# Patient Record
Sex: Female | Born: 1990 | Race: White | Hispanic: No | Marital: Married | Smoking: Never smoker
Health system: Southern US, Community
[De-identification: ages and names within clinical notes are randomized; demographics above are authoritative.]

## PROBLEM LIST (undated history)

## (undated) DIAGNOSIS — O139 Gestational [pregnancy-induced] hypertension without significant proteinuria, unspecified trimester: Secondary | ICD-10-CM

## (undated) DIAGNOSIS — N809 Endometriosis, unspecified: Secondary | ICD-10-CM

## (undated) DIAGNOSIS — D509 Iron deficiency anemia, unspecified: Secondary | ICD-10-CM

## (undated) DIAGNOSIS — J45909 Unspecified asthma, uncomplicated: Secondary | ICD-10-CM

## (undated) DIAGNOSIS — K589 Irritable bowel syndrome without diarrhea: Secondary | ICD-10-CM

## (undated) DIAGNOSIS — K219 Gastro-esophageal reflux disease without esophagitis: Secondary | ICD-10-CM

## (undated) HISTORY — PX: HERNIA REPAIR: SHX51

## (undated) HISTORY — PX: WISDOM TOOTH EXTRACTION: SHX21

---

## 2013-03-13 DIAGNOSIS — D509 Iron deficiency anemia, unspecified: Secondary | ICD-10-CM | POA: Insufficient documentation

## 2013-08-28 DIAGNOSIS — J45909 Unspecified asthma, uncomplicated: Secondary | ICD-10-CM | POA: Insufficient documentation

## 2019-05-21 DIAGNOSIS — Z3402 Encounter for supervision of normal first pregnancy, second trimester: Secondary | ICD-10-CM | POA: Insufficient documentation

## 2019-05-24 LAB — OB RESULTS CONSOLE HEPATITIS B SURFACE ANTIGEN: Hepatitis B Surface Ag: NEGATIVE

## 2019-05-24 LAB — OB RESULTS CONSOLE RUBELLA ANTIBODY, IGM: Rubella: IMMUNE

## 2019-05-24 LAB — OB RESULTS CONSOLE ABO/RH: RH Type: POSITIVE

## 2019-05-24 LAB — OB RESULTS CONSOLE ANTIBODY SCREEN: Antibody Screen: NEGATIVE

## 2019-05-24 LAB — OB RESULTS CONSOLE GC/CHLAMYDIA
Chlamydia: NEGATIVE
Gonorrhea: NEGATIVE

## 2019-05-24 LAB — OB RESULTS CONSOLE VARICELLA ZOSTER ANTIBODY, IGG: Varicella: IMMUNE

## 2019-09-14 ENCOUNTER — Inpatient Hospital Stay (HOSPITAL_COMMUNITY): Admission: AD | Admit: 2019-09-14 | Payer: 59 | Source: Home / Self Care | Admitting: Family Medicine

## 2019-10-08 ENCOUNTER — Observation Stay: Payer: 59

## 2019-10-08 ENCOUNTER — Other Ambulatory Visit: Payer: Self-pay

## 2019-10-08 ENCOUNTER — Observation Stay
Admission: EM | Admit: 2019-10-08 | Discharge: 2019-10-08 | Disposition: A | Discharge status: Home / Self Care | Payer: 59 | Attending: Certified Nurse Midwife | Admitting: Certified Nurse Midwife

## 2019-10-08 DIAGNOSIS — R109 Unspecified abdominal pain: Secondary | ICD-10-CM

## 2019-10-08 DIAGNOSIS — Z3A32 32 weeks gestation of pregnancy: Secondary | ICD-10-CM | POA: Diagnosis not present

## 2019-10-08 DIAGNOSIS — R197 Diarrhea, unspecified: Secondary | ICD-10-CM | POA: Insufficient documentation

## 2019-10-08 DIAGNOSIS — O26893 Other specified pregnancy related conditions, third trimester: Secondary | ICD-10-CM | POA: Diagnosis not present

## 2019-10-08 DIAGNOSIS — R1031 Right lower quadrant pain: Secondary | ICD-10-CM | POA: Insufficient documentation

## 2019-10-08 DIAGNOSIS — O99891 Other specified diseases and conditions complicating pregnancy: Secondary | ICD-10-CM | POA: Diagnosis not present

## 2019-10-08 DIAGNOSIS — O26899 Other specified pregnancy related conditions, unspecified trimester: Secondary | ICD-10-CM | POA: Diagnosis present

## 2019-10-08 DIAGNOSIS — O212 Late vomiting of pregnancy: Secondary | ICD-10-CM | POA: Insufficient documentation

## 2019-10-08 HISTORY — DX: Unspecified asthma, uncomplicated: J45.909

## 2019-10-08 LAB — URINALYSIS, COMPLETE (UACMP) WITH MICROSCOPIC
Bacteria, UA: NONE SEEN
Bilirubin Urine: NEGATIVE
Glucose, UA: NEGATIVE mg/dL
Hgb urine dipstick: NEGATIVE
Ketones, ur: 5 mg/dL — AB
Nitrite: NEGATIVE
Protein, ur: NEGATIVE mg/dL
Specific Gravity, Urine: 1.013 (ref 1.005–1.030)
pH: 6 (ref 5.0–8.0)

## 2019-10-08 LAB — COMPREHENSIVE METABOLIC PANEL
ALT: 10 U/L (ref 0–44)
AST: 14 U/L — ABNORMAL LOW (ref 15–41)
Albumin: 3 g/dL — ABNORMAL LOW (ref 3.5–5.0)
Alkaline Phosphatase: 82 U/L (ref 38–126)
Anion gap: 8 (ref 5–15)
BUN: 5 mg/dL — ABNORMAL LOW (ref 6–20)
CO2: 21 mmol/L — ABNORMAL LOW (ref 22–32)
Calcium: 8.8 mg/dL — ABNORMAL LOW (ref 8.9–10.3)
Chloride: 107 mmol/L (ref 98–111)
Creatinine, Ser: 0.45 mg/dL (ref 0.44–1.00)
GFR calc Af Amer: >60 mL/min (ref >60)
GFR calc non Af Amer: >60 mL/min (ref >60)
Glucose, Bld: 91 mg/dL (ref 70–99)
Potassium: 3.7 mmol/L (ref 3.5–5.1)
Sodium: 136 mmol/L (ref 135–145)
Total Bilirubin: 0.5 mg/dL (ref 0.3–1.2)
Total Protein: 6.5 g/dL (ref 6.5–8.1)

## 2019-10-08 LAB — LIPASE, BLOOD: Lipase: 20 U/L (ref 11–51)

## 2019-10-08 LAB — CBC
HCT: 31.2 % — ABNORMAL LOW (ref 36.0–46.0)
Hemoglobin: 10.5 g/dL — ABNORMAL LOW (ref 12.0–15.0)
MCH: 25.7 pg — ABNORMAL LOW (ref 26.0–34.0)
MCHC: 33.7 g/dL (ref 30.0–36.0)
MCV: 76.5 fL — ABNORMAL LOW (ref 80.0–100.0)
Platelets: 277 10*3/uL (ref 150–400)
RBC: 4.08 MIL/uL (ref 3.87–5.11)
RDW: 13.2 % (ref 11.5–15.5)
WBC: 10.5 10*3/uL (ref 4.0–10.5)
nRBC: 0 % (ref 0.0–0.2)

## 2019-10-08 LAB — AMYLASE: Amylase: 37 U/L (ref 28–100)

## 2019-10-08 MED ORDER — SIMETHICONE 80 MG PO CHEW
240.0000 mg | CHEWABLE_TABLET | Freq: Once | ORAL | Status: AC
  Administered 2019-10-08: 240 mg via ORAL
  Filled 2019-10-08: qty 3

## 2019-10-08 MED ORDER — LACTATED RINGERS IV BOLUS
1000.0000 mL | Freq: Once | INTRAVENOUS | Status: AC
  Administered 2019-10-08: 1000 mL via INTRAVENOUS

## 2019-10-08 MED ORDER — ONDANSETRON HCL 4 MG/2ML IJ SOLN
4.0000 mg | Freq: Three times a day (TID) | INTRAMUSCULAR | Status: DC | PRN
  Administered 2019-10-08: 4 mg via INTRAVENOUS
  Filled 2019-10-08: qty 2

## 2019-10-08 MED ORDER — ACETAMINOPHEN 500 MG PO TABS
1000.0000 mg | ORAL_TABLET | Freq: Four times a day (QID) | ORAL | Status: DC | PRN
  Administered 2019-10-08: 1000 mg via ORAL
  Filled 2019-10-08: qty 2

## 2019-10-08 MED ORDER — LOPERAMIDE HCL 2 MG PO CAPS
2.0000 mg | ORAL_CAPSULE | ORAL | Status: DC | PRN
  Filled 2019-10-08: qty 1

## 2019-10-08 NOTE — Discharge Summary (Signed)
Patient discharged home, discharge instructions given, patient states understanding. Patient left floor in stable condition, denies any other needs at this time. Patient to keep next scheduled OB appointment for tomorrow

## 2019-10-08 NOTE — Discharge Instructions (Signed)
Call your provider for any other concerns. Keep you next scheduled OB appointment.

## 2019-10-08 NOTE — OB Triage Note (Signed)
Pt is a G1P0 and [redacted]w[redacted]d presenting to L&D with c/o lower right abdominal pain radiating to her back. Pt states pain has been there all weekend but intensified around 0400 this morning. Pt rates pain 8/10 on a 0-10 pain scale. Pt denies LOF or VB and confirms positive fetal movement. Pt states she is well hydrated and denies burning with urination. Pt denies recent intercourse. Pt states she has thrown up 3 times since 0400 this morning and states she has had 7-8 diarrhea occurrences the last 24 hours. Pt was out in the sun for a long time yesterday. Monitors applied and assessing, VSS. No further needs at this time.

## 2019-10-08 NOTE — Discharge Summary (Signed)
Suzanne Romero is a 29 y.o. female. She is at [redacted]w[redacted]d gestation. Patient's last menstrual period was 02/26/2019. Estimated Date of Delivery: 12/03/19  [facility]renatal care site: [facility]  Chief complaint: RLQ abdominal pain Location: RLQ, radiating to her right side and back Onset/timing: 2-3 days ago, but worsened this morning around 0400 Duration: constant, but waxes and wanes in intensity Quality: cramping, sharp Severity: 8/10 Associated signs/symptoms: vomiting, diarrhea Context: Suzanne Romero reports a right lower quadrant abdominal pain that started earlier this weekend, but worsened yesterday evening and then again around 0400 this morning. It is severe at this time. She has had nausea with the pain, and has 3 episodes of emesis since 0400 today. She also reports 7-8 episodes of diarrhea in the past 24 hours. No recent intercourse.  S: Resting comfortably.   She reports:  -active fetal movement -no leakage of fluid -no vaginal bleeding -no contractions  Maternal Medical History:   Past Medical History:  Diagnosis Date  . Asthma     Past Surgical History:  Procedure Laterality Date  . HERNIA REPAIR      No Known Allergies  Prior to Admission medications   Medication Sig Start Date End Date Taking? Authorizing Provider  magnesium oxide (MAG-OX) 400 MG tablet Take 400 mg by mouth daily.   Yes [provider]  Prenatal Vit-Fe Fumarate-FA (PRENATAL MULTIVITAMIN) TABS tablet Take 1 tablet by mouth daily at 12 noon.   Yes [provider]     Social History: She  reports that she has never smoked. She has never used smokeless tobacco. She reports previous alcohol use. She reports that she does not use drugs.  Family History: family history includes Hypertension in her father and maternal grandfather; Stroke in her paternal grandmother.   Review of Systems: A full review of systems was performed and negative except as noted in the HPI.     O:  BP (!)  119/57   Pulse 62   Temp 98.1 F (36.7 C) (Oral)   Resp 16   LMP 02/26/2019  Results for orders placed or performed during the hospital encounter of 10/08/19 (from the past 48 hour(s))  Urinalysis, Complete w Microscopic   Collection Time: 10/08/19  5:21 AM  Result Value Ref Range   Color, Urine YELLOW (A) YELLOW   APPearance HAZY (A) CLEAR   Specific Gravity, Urine 1.013 1.005 - 1.030   pH 6.0 5.0 - 8.0   Glucose, UA NEGATIVE NEGATIVE mg/dL   Hgb urine dipstick NEGATIVE NEGATIVE   Bilirubin Urine NEGATIVE NEGATIVE   Ketones, ur 5 (A) NEGATIVE mg/dL   Protein, ur NEGATIVE NEGATIVE mg/dL   Nitrite NEGATIVE NEGATIVE   Leukocytes,Ua SMALL (A) NEGATIVE   WBC, UA 0-5 0 - 5 WBC/hpf   Bacteria, UA NONE SEEN NONE SEEN   Squamous Epithelial / LPF 11-20 0 - 5   Mucus PRESENT   Comprehensive metabolic panel   Collection Time: 10/08/19  5:33 AM  Result Value Ref Range   Sodium 136 135 - 145 mmol/L   Potassium 3.7 3.5 - 5.1 mmol/L   Chloride 107 98 - 111 mmol/L   CO2 21 (L) 22 - 32 mmol/L   Glucose, Bld 91 70 - 99 mg/dL   BUN 5 (L) 6 - 20 mg/dL   Creatinine, Ser 0.45 0.44 - 1.00 mg/dL   Calcium 8.8 (L) 8.9 - 10.3 mg/dL   Total Protein 6.5 6.5 - 8.1 g/dL   Albumin 3.0 (L) 3.5 - 5.0 g/dL  AST 14 (L) 15 - 41 U/L   ALT 10 0 - 44 U/L   Alkaline Phosphatase 82 38 - 126 U/L   Total Bilirubin 0.5 0.3 - 1.2 mg/dL   GFR calc non Af Amer >60 >60 mL/min   GFR calc Af Amer >60 >60 mL/min   Anion gap 8 5 - 15  Lipase, blood   Collection Time: 10/08/19  5:33 AM  Result Value Ref Range   Lipase 20 11 - 51 U/L  CBC on admission   Collection Time: 10/08/19  5:33 AM  Result Value Ref Range   WBC 10.5 4.0 - 10.5 K/uL   RBC 4.08 3.87 - 5.11 MIL/uL   Hemoglobin 10.5 (L) 12.0 - 15.0 g/dL   HCT 31.2 (L) 36.0 - 46.0 %   MCV 76.5 (L) 80.0 - 100.0 fL   MCH 25.7 (L) 26.0 - 34.0 pg   MCHC 33.7 30.0 - 36.0 g/dL   RDW 13.2 11.5 - 15.5 %   Platelets 277 150 - 400 K/uL   nRBC 0.0 0.0 - 0.2 %   Amylase   Collection Time: 10/08/19  5:33 AM  Result Value Ref Range   Amylase 37 28 - 100 U/L     Constitutional: mild distress, AAOx3  HE/ENT: extraocular movements grossly intact, moist mucous membranes CV: RRR PULM: normal respiratory effort, CTABL Back: symmetric, no CVAT  Abd: gravid, non-distended, soft, RLQ and right flank tenderness to palpation, no rebound tenderness Ext: Non-tender, Nonedmeatous   Psych: mood appropriate, speech normal Pelvic deferred  NST:  Baseline: 145bpm Variability: moderate Accelerations: 15x15 present x >2 Decelerations: absent Time: 56mins Toco: quiet   A/P: 29 y.o. [redacted]w[redacted]d here for antenatal surveillance during pregnancy.  Principle diagnosis: RLQ abdominal and R flank pain during pregnancy  Labor  Not present  Fetal Wellbeing  Reactive NST, reassuring for GA  RLQ abdominal and flank pain  Afebrile, VSS  CBC, CMP, amylase, lipase, and UA WNL  Renal ultrasound WNL, lots of gas per ultrasound tech  S/p Tylenol 1000mg  and simethicone 240mg   Pain improved from 8/10 to 2-3/10  Nausea, vomiting, diarrhea  S/p 1L IV fluid bolus   S/p Zofran 4mg  IV once  Imodium PRN for any further episodes of diarrhea  Discharge home stable, precautions reviewed, follow-up as schedule   Lisette Grinder 10/08/2019 7:39 AM  ----- Lisette Grinder, CNM Certified Nurse Midwife [facility], Department of [facility]

## 2019-10-08 NOTE — Progress Notes (Signed)
   Malak Duchesneau is a 29 y.o. female. She is at [redacted]w[redacted]d gestation. Patient's last menstrual period was 02/26/2019. Estimated Date of Delivery: 12/03/19  Prenatal care site: [facility]  Chief complaint: RLQ abdominal pain Location: RLQ, radiating to her right side and back Onset/timing: 2-3 days ago, but worsened this morning around 0400 Duration: constant, but waxes and wanes in intensity Quality: cramping, sharp Severity: 8/10 Associated signs/symptoms: vomiting, diarrhea Context: Taimane reports a right lower quadrant abdominal pain that started earlier this weekend, but worsened yesterday evening and then again around 0400 this morning. It is severe at this time. She has had nausea with the pain, and has 3 episodes of emesis since 0400 today. She also reports 7-8 episodes of diarrhea in the past 24 hours. No recent intercourse.  S: Resting comfortably.   She reports:  -active fetal movement -no leakage of fluid -no vaginal bleeding -no contractions  Maternal Medical History:   Past Medical History:  Diagnosis Date  . Asthma     Past Surgical History:  Procedure Laterality Date  . HERNIA REPAIR      No Known Allergies  Prior to Admission medications   Medication Sig Start Date End Date Taking? Authorizing Provider  magnesium oxide (MAG-OX) 400 MG tablet Take 400 mg by mouth daily.   Yes [provider]  Prenatal Vit-Fe Fumarate-FA (PRENATAL MULTIVITAMIN) TABS tablet Take 1 tablet by mouth daily at 12 noon.   Yes [provider]     Social History: She  reports that she has never smoked. She has never used smokeless tobacco. She reports previous alcohol use. She reports that she does not use drugs.  Family History: family history includes Hypertension in her father and maternal grandfather; Stroke in her paternal grandmother.   Review of Systems: A full review of systems was performed and negative except as noted in the HPI.     O:  BP 126/70  (BP Location: Right Arm)   Pulse 83   Temp 98.1 F (36.7 C) (Oral)   Resp 16   LMP 02/26/2019  No results found for this or any previous visit (from the past 48 hour(s)).   Constitutional: mild distress, AAOx3  HE/ENT: extraocular movements grossly intact, moist mucous membranes CV: RRR PULM: normal respiratory effort, CTABL Back: symmetric, no CVAT  Abd: gravid, non-distended, soft, RLQ and right flank tenderness to palpation, no rebound tenderness Ext: Non-tender, Nonedmeatous   Psych: mood appropriate, speech normal Pelvic deferred  NST:  Baseline: 145bpm Variability: moderate Accelerations: 15x15 present x >2 Decelerations: absent Time: 7mins Toco: quiet   A/P: 29 y.o. [redacted]w[redacted]d here for antenatal surveillance during pregnancy.  Principle diagnosis: RLQ abdomina and R flank pain during pregnancy  Labor  Not present  Fetal Wellbeing  Reactive NST, reassuring for GA  RLQ abdominal and flank pain  Afebrile, VSS  CBC, CMP, amylase, lipase, and UA  Renal ultrasound ordered  Consider MRI if ultrasound normal  Nausea, vomiting, diarrhea  IV fluid bolus of 1L LR ordered   Lisette Grinder 10/08/2019 5:37 AM  ----- Lisette Grinder, CNM Certified Nurse Midwife [facility], Department of OB/GYN [facility]

## 2019-11-08 LAB — OB RESULTS CONSOLE HIV ANTIBODY (ROUTINE TESTING): HIV: NONREACTIVE

## 2019-11-08 LAB — OB RESULTS CONSOLE RPR: RPR: NONREACTIVE

## 2019-11-08 LAB — OB RESULTS CONSOLE GBS: GBS: NEGATIVE

## 2019-11-29 ENCOUNTER — Inpatient Hospital Stay
Admission: EM | Admit: 2019-11-29 | Discharge: 2019-12-03 | DRG: 787 | Disposition: A | Discharge status: Home / Self Care | Payer: 59

## 2019-11-29 ENCOUNTER — Other Ambulatory Visit: Payer: Self-pay

## 2019-11-29 ENCOUNTER — Encounter: Payer: Self-pay | Admitting: Obstetrics & Gynecology

## 2019-11-29 DIAGNOSIS — D62 Acute posthemorrhagic anemia: Secondary | ICD-10-CM | POA: Diagnosis not present

## 2019-11-29 DIAGNOSIS — O139 Gestational [pregnancy-induced] hypertension without significant proteinuria, unspecified trimester: Secondary | ICD-10-CM | POA: Diagnosis present

## 2019-11-29 DIAGNOSIS — N800 Endometriosis of uterus: Secondary | ICD-10-CM | POA: Diagnosis present

## 2019-11-29 DIAGNOSIS — O99892 Other specified diseases and conditions complicating childbirth: Secondary | ICD-10-CM | POA: Diagnosis present

## 2019-11-29 DIAGNOSIS — Z3A39 39 weeks gestation of pregnancy: Secondary | ICD-10-CM

## 2019-11-29 DIAGNOSIS — Z20822 Contact with and (suspected) exposure to covid-19: Secondary | ICD-10-CM | POA: Diagnosis present

## 2019-11-29 DIAGNOSIS — O134 Gestational [pregnancy-induced] hypertension without significant proteinuria, complicating childbirth: Principal | ICD-10-CM | POA: Diagnosis present

## 2019-11-29 DIAGNOSIS — O9081 Anemia of the puerperium: Secondary | ICD-10-CM | POA: Diagnosis not present

## 2019-11-29 DIAGNOSIS — O219 Vomiting of pregnancy, unspecified: Secondary | ICD-10-CM | POA: Diagnosis present

## 2019-11-29 DIAGNOSIS — D252 Subserosal leiomyoma of uterus: Secondary | ICD-10-CM | POA: Diagnosis present

## 2019-11-29 DIAGNOSIS — O3413 Maternal care for benign tumor of corpus uteri, third trimester: Secondary | ICD-10-CM | POA: Diagnosis present

## 2019-11-29 LAB — SARS CORONAVIRUS 2 BY RT PCR (HOSPITAL ORDER, PERFORMED IN ~~LOC~~ HOSPITAL LAB): SARS Coronavirus 2: NEGATIVE

## 2019-11-29 LAB — CBC
HCT: 31.9 % — ABNORMAL LOW (ref 36.0–46.0)
Hemoglobin: 10 g/dL — ABNORMAL LOW (ref 12.0–15.0)
MCH: 24.3 pg — ABNORMAL LOW (ref 26.0–34.0)
MCHC: 31.3 g/dL (ref 30.0–36.0)
MCV: 77.4 fL — ABNORMAL LOW (ref 80.0–100.0)
Platelets: 247 10*3/uL (ref 150–400)
RBC: 4.12 MIL/uL (ref 3.87–5.11)
RDW: 15 % (ref 11.5–15.5)
WBC: 12.7 10*3/uL — ABNORMAL HIGH (ref 4.0–10.5)
nRBC: 0 % (ref 0.0–0.2)

## 2019-11-29 LAB — COMPREHENSIVE METABOLIC PANEL
ALT: 10 U/L (ref 0–44)
AST: 15 U/L (ref 15–41)
Albumin: 2.9 g/dL — ABNORMAL LOW (ref 3.5–5.0)
Alkaline Phosphatase: 123 U/L (ref 38–126)
Anion gap: 10 (ref 5–15)
BUN: 9 mg/dL (ref 6–20)
CO2: 20 mmol/L — ABNORMAL LOW (ref 22–32)
Calcium: 8.7 mg/dL — ABNORMAL LOW (ref 8.9–10.3)
Chloride: 107 mmol/L (ref 98–111)
Creatinine, Ser: 0.55 mg/dL (ref 0.44–1.00)
GFR calc Af Amer: >60 mL/min (ref >60)
GFR calc non Af Amer: >60 mL/min (ref >60)
Glucose, Bld: 82 mg/dL (ref 70–99)
Potassium: 3.8 mmol/L (ref 3.5–5.1)
Sodium: 137 mmol/L (ref 135–145)
Total Bilirubin: 0.5 mg/dL (ref 0.3–1.2)
Total Protein: 6.4 g/dL — ABNORMAL LOW (ref 6.5–8.1)

## 2019-11-29 MED ORDER — MAGNESIUM OXIDE 400 (241.3 MG) MG PO TABS
800.0000 mg | ORAL_TABLET | Freq: Once | ORAL | Status: AC
  Administered 2019-11-30: 800 mg via ORAL
  Filled 2019-11-29: qty 2

## 2019-11-29 MED ORDER — CALCIUM CARBONATE ANTACID 500 MG PO CHEW: 2.0000 | CHEWABLE_TABLET | ORAL | Status: DC | PRN

## 2019-11-29 MED ORDER — LACTATED RINGERS IV BOLUS
1000.0000 mL | Freq: Once | INTRAVENOUS | Status: AC
  Administered 2019-11-29: 1000 mL via INTRAVENOUS

## 2019-11-29 MED ORDER — ONDANSETRON HCL 4 MG/2ML IJ SOLN
4.0000 mg | Freq: Four times a day (QID) | INTRAMUSCULAR | Status: DC
  Administered 2019-11-29: 4 mg via INTRAVENOUS
  Filled 2019-11-29: qty 2

## 2019-11-29 MED ORDER — ACETAMINOPHEN 325 MG PO TABS
650.0000 mg | ORAL_TABLET | ORAL | Status: DC | PRN
  Administered 2019-11-30: 650 mg via ORAL
  Filled 2019-11-29: qty 2

## 2019-11-30 ENCOUNTER — Inpatient Hospital Stay: Payer: 59 | Admitting: Anesthesiology

## 2019-11-30 ENCOUNTER — Encounter: Payer: Self-pay | Admitting: Obstetrics & Gynecology

## 2019-11-30 ENCOUNTER — Encounter: Admission: EM | Disposition: A | Discharge status: Home / Self Care | Payer: Self-pay | Source: Home / Self Care

## 2019-11-30 DIAGNOSIS — N800 Endometriosis of uterus: Secondary | ICD-10-CM | POA: Diagnosis present

## 2019-11-30 DIAGNOSIS — O139 Gestational [pregnancy-induced] hypertension without significant proteinuria, unspecified trimester: Secondary | ICD-10-CM | POA: Diagnosis present

## 2019-11-30 DIAGNOSIS — D62 Acute posthemorrhagic anemia: Secondary | ICD-10-CM | POA: Diagnosis not present

## 2019-11-30 DIAGNOSIS — O26893 Other specified pregnancy related conditions, third trimester: Secondary | ICD-10-CM | POA: Diagnosis present

## 2019-11-30 DIAGNOSIS — Z3A39 39 weeks gestation of pregnancy: Secondary | ICD-10-CM | POA: Diagnosis not present

## 2019-11-30 DIAGNOSIS — O134 Gestational [pregnancy-induced] hypertension without significant proteinuria, complicating childbirth: Secondary | ICD-10-CM | POA: Diagnosis present

## 2019-11-30 DIAGNOSIS — Z20822 Contact with and (suspected) exposure to covid-19: Secondary | ICD-10-CM | POA: Diagnosis present

## 2019-11-30 DIAGNOSIS — D252 Subserosal leiomyoma of uterus: Secondary | ICD-10-CM | POA: Diagnosis present

## 2019-11-30 DIAGNOSIS — O99892 Other specified diseases and conditions complicating childbirth: Secondary | ICD-10-CM | POA: Diagnosis present

## 2019-11-30 DIAGNOSIS — K589 Irritable bowel syndrome without diarrhea: Secondary | ICD-10-CM | POA: Insufficient documentation

## 2019-11-30 DIAGNOSIS — O3413 Maternal care for benign tumor of corpus uteri, third trimester: Secondary | ICD-10-CM | POA: Diagnosis present

## 2019-11-30 DIAGNOSIS — O9081 Anemia of the puerperium: Secondary | ICD-10-CM | POA: Diagnosis not present

## 2019-11-30 LAB — SAMPLE TO BLOOD BANK

## 2019-11-30 LAB — PROTEIN / CREATININE RATIO, URINE
Creatinine, Urine: 39 mg/dL
Protein Creatinine Ratio: 0.26 mg/mg{Cre} — ABNORMAL HIGH (ref 0.00–0.15)
Total Protein, Urine: 10 mg/dL

## 2019-11-30 SURGERY — Surgical Case
Anesthesia: Epidural

## 2019-11-30 MED ORDER — OXYTOCIN-SODIUM CHLORIDE 30-0.9 UT/500ML-% IV SOLN
INTRAVENOUS | Status: DC | PRN
  Administered 2019-11-30: 400 mL via INTRAVENOUS

## 2019-11-30 MED ORDER — BUPIVACAINE 0.25 % ON-Q PUMP DUAL CATH 400 ML
400.0000 mL | INJECTION | Status: DC
  Filled 2019-11-30: qty 400

## 2019-11-30 MED ORDER — BUPIVACAINE HCL (PF) 0.5 % IJ SOLN
INTRAMUSCULAR | Status: AC
  Filled 2019-11-30: qty 30

## 2019-11-30 MED ORDER — FENTANYL 2.5 MCG/ML W/ROPIVACAINE 0.15% IN NS 100 ML EPIDURAL (ARMC)
EPIDURAL | Status: AC
  Filled 2019-11-30: qty 100

## 2019-11-30 MED ORDER — KETOROLAC TROMETHAMINE 15 MG/ML IJ SOLN
15.0000 mg | Freq: Four times a day (QID) | INTRAMUSCULAR | Status: DC | PRN
  Administered 2019-11-30 – 2019-12-01 (×3): 15 mg via INTRAVENOUS
  Filled 2019-11-30 (×6): qty 1

## 2019-11-30 MED ORDER — AMMONIA AROMATIC IN INHA
RESPIRATORY_TRACT | Status: AC
  Filled 2019-11-30: qty 10

## 2019-11-30 MED ORDER — BUPIVACAINE HCL (PF) 0.5 % IJ SOLN
INTRAMUSCULAR | Status: DC | PRN
  Administered 2019-11-30: 30 mL

## 2019-11-30 MED ORDER — NALBUPHINE HCL 10 MG/ML IJ SOLN: 2.5000 mg | Freq: Four times a day (QID) | INTRAMUSCULAR | Status: DC | PRN

## 2019-11-30 MED ORDER — COCONUT OIL OIL
1.0000 "application " | TOPICAL_OIL | Status: DC | PRN
  Filled 2019-11-30: qty 120

## 2019-11-30 MED ORDER — LIDOCAINE HCL (PF) 1 % IJ SOLN: 30.0000 mL | INTRAMUSCULAR | Status: DC | PRN

## 2019-11-30 MED ORDER — ACETAMINOPHEN 500 MG PO TABS: 1000.0000 mg | ORAL_TABLET | Freq: Four times a day (QID) | ORAL | Status: DC | PRN

## 2019-11-30 MED ORDER — IBUPROFEN 800 MG PO TABS
800.0000 mg | ORAL_TABLET | Freq: Three times a day (TID) | ORAL | Status: DC
  Administered 2019-12-01 – 2019-12-03 (×7): 800 mg via ORAL
  Filled 2019-11-30 (×7): qty 1

## 2019-11-30 MED ORDER — SOD CITRATE-CITRIC ACID 500-334 MG/5ML PO SOLN: 30.0000 mL | ORAL | Status: AC

## 2019-11-30 MED ORDER — LACTATED RINGERS IV SOLN: 500.0000 mL | Freq: Once | INTRAVENOUS | Status: DC

## 2019-11-30 MED ORDER — LACTATED RINGERS IV SOLN: INTRAVENOUS | Status: DC

## 2019-11-30 MED ORDER — LACTATED RINGERS IV SOLN: 500.0000 mL | INTRAVENOUS | Status: DC | PRN

## 2019-11-30 MED ORDER — OXYTOCIN 10 UNIT/ML IJ SOLN
INTRAMUSCULAR | Status: AC
  Filled 2019-11-30: qty 2

## 2019-11-30 MED ORDER — SOD CITRATE-CITRIC ACID 500-334 MG/5ML PO SOLN
ORAL | Status: AC
  Administered 2019-11-30: 30 mL via ORAL
  Filled 2019-11-30: qty 30

## 2019-11-30 MED ORDER — MORPHINE SULFATE (PF) 0.5 MG/ML IJ SOLN
INTRAMUSCULAR | Status: AC
  Filled 2019-11-30: qty 10

## 2019-11-30 MED ORDER — LIDOCAINE HCL (PF) 1 % IJ SOLN
INTRAMUSCULAR | Status: DC | PRN
  Administered 2019-11-30: 3 mL

## 2019-11-30 MED ORDER — ONDANSETRON HCL 4 MG/2ML IJ SOLN: 4.0000 mg | Freq: Four times a day (QID) | INTRAMUSCULAR | Status: DC | PRN

## 2019-11-30 MED ORDER — TETANUS-DIPHTH-ACELL PERTUSSIS 5-2.5-18.5 LF-MCG/0.5 IM SUSP
0.5000 mL | Freq: Once | INTRAMUSCULAR | Status: DC
  Filled 2019-11-30: qty 0.5

## 2019-11-30 MED ORDER — EPHEDRINE 5 MG/ML INJ: 10.0000 mg | INTRAVENOUS | Status: DC | PRN

## 2019-11-30 MED ORDER — ONDANSETRON HCL 4 MG/2ML IJ SOLN
INTRAMUSCULAR | Status: DC | PRN
  Administered 2019-11-30 (×2): 4 mg via INTRAVENOUS

## 2019-11-30 MED ORDER — MORPHINE SULFATE (PF) 0.5 MG/ML IJ SOLN
INTRAMUSCULAR | Status: DC | PRN
  Administered 2019-11-30: 3 mg via EPIDURAL

## 2019-11-30 MED ORDER — LACTATED RINGERS IV SOLN: INTRAVENOUS | Status: DC | PRN

## 2019-11-30 MED ORDER — LIDOCAINE-EPINEPHRINE (PF) 1.5 %-1:200000 IJ SOLN
INTRAMUSCULAR | Status: DC | PRN
  Administered 2019-11-30: 3 mL via PERINEURAL

## 2019-11-30 MED ORDER — LIDOCAINE 2% (20 MG/ML) 5 ML SYRINGE
INTRAMUSCULAR | Status: DC | PRN
  Administered 2019-11-30 (×2): 5 mL via INTRAVENOUS
  Administered 2019-11-30: 2 mL via INTRAVENOUS
  Administered 2019-11-30: 3 mL via INTRAVENOUS
  Administered 2019-11-30 (×2): 5 mL via INTRAVENOUS

## 2019-11-30 MED ORDER — FENTANYL 2.5 MCG/ML W/ROPIVACAINE 0.15% IN NS 100 ML EPIDURAL (ARMC)
EPIDURAL | Status: DC | PRN
  Administered 2019-11-30: 12 mL/h via EPIDURAL

## 2019-11-30 MED ORDER — ONDANSETRON HCL 4 MG/2ML IJ SOLN: 4.0000 mg | Freq: Once | INTRAMUSCULAR | Status: DC | PRN

## 2019-11-30 MED ORDER — SOD CITRATE-CITRIC ACID 500-334 MG/5ML PO SOLN: 30.0000 mL | ORAL | Status: DC | PRN

## 2019-11-30 MED ORDER — LIDOCAINE HCL (PF) 1 % IJ SOLN
INTRAMUSCULAR | Status: AC
  Filled 2019-11-30: qty 30

## 2019-11-30 MED ORDER — OXYCODONE-ACETAMINOPHEN 5-325 MG PO TABS
1.0000 | ORAL_TABLET | ORAL | Status: DC | PRN
  Administered 2019-12-02: 1 via ORAL
  Filled 2019-11-30: qty 1
  Filled 2019-11-30: qty 2

## 2019-11-30 MED ORDER — SODIUM CHLORIDE 0.9 % IV SOLN
INTRAVENOUS | Status: DC | PRN
  Administered 2019-11-30: 30 ug/min via INTRAVENOUS

## 2019-11-30 MED ORDER — BUPIVACAINE HCL (PF) 0.25 % IJ SOLN
INTRAMUSCULAR | Status: DC | PRN
  Administered 2019-11-30 (×2): 5 mL via EPIDURAL

## 2019-11-30 MED ORDER — PHENYLEPHRINE 40 MCG/ML (10ML) SYRINGE FOR IV PUSH (FOR BLOOD PRESSURE SUPPORT): 80.0000 ug | PREFILLED_SYRINGE | INTRAVENOUS | Status: DC | PRN

## 2019-11-30 MED ORDER — MENTHOL 3 MG MT LOZG
1.0000 | LOZENGE | OROMUCOSAL | Status: DC | PRN
  Filled 2019-11-30: qty 9

## 2019-11-30 MED ORDER — MISOPROSTOL 50MCG HALF TABLET
50.0000 ug | ORAL_TABLET | ORAL | Status: DC | PRN
  Administered 2019-11-30 (×2): 50 ug via BUCCAL
  Filled 2019-11-30 (×2): qty 1

## 2019-11-30 MED ORDER — MORPHINE SULFATE (PF) 2 MG/ML IV SOLN: 1.0000 mg | INTRAVENOUS | Status: AC | PRN

## 2019-11-30 MED ORDER — FENTANYL 2.5 MCG/ML W/ROPIVACAINE 0.15% IN NS 100 ML EPIDURAL (ARMC): 12.0000 mL/h | EPIDURAL | Status: DC

## 2019-11-30 MED ORDER — SENNOSIDES-DOCUSATE SODIUM 8.6-50 MG PO TABS
2.0000 | ORAL_TABLET | ORAL | Status: DC
  Administered 2019-12-01 – 2019-12-03 (×3): 2 via ORAL
  Filled 2019-11-30 (×3): qty 2

## 2019-11-30 MED ORDER — BUTORPHANOL TARTRATE 1 MG/ML IJ SOLN: 1.0000 mg | INTRAMUSCULAR | Status: DC | PRN

## 2019-11-30 MED ORDER — SIMETHICONE 80 MG PO CHEW
80.0000 mg | CHEWABLE_TABLET | ORAL | Status: DC
  Administered 2019-12-01 – 2019-12-02 (×2): 80 mg via ORAL
  Filled 2019-11-30 (×2): qty 1

## 2019-11-30 MED ORDER — FENTANYL CITRATE (PF) 100 MCG/2ML IJ SOLN
INTRAMUSCULAR | Status: AC
  Filled 2019-11-30: qty 2

## 2019-11-30 MED ORDER — OXYTOCIN-SODIUM CHLORIDE 30-0.9 UT/500ML-% IV SOLN
INTRAVENOUS | Status: AC
  Filled 2019-11-30: qty 500

## 2019-11-30 MED ORDER — WITCH HAZEL-GLYCERIN EX PADS: 1.0000 "application " | MEDICATED_PAD | CUTANEOUS | Status: DC | PRN

## 2019-11-30 MED ORDER — SIMETHICONE 80 MG PO CHEW
80.0000 mg | CHEWABLE_TABLET | Freq: Three times a day (TID) | ORAL | Status: DC
  Administered 2019-12-01 – 2019-12-03 (×7): 80 mg via ORAL
  Filled 2019-11-30 (×7): qty 1

## 2019-11-30 MED ORDER — OXYTOCIN-SODIUM CHLORIDE 30-0.9 UT/500ML-% IV SOLN: 1.0000 m[IU]/min | INTRAVENOUS | Status: DC

## 2019-11-30 MED ORDER — BUPIVACAINE HCL 0.5 % IJ SOLN
20.0000 mL | INTRAMUSCULAR | Status: DC
  Filled 2019-11-30: qty 20

## 2019-11-30 MED ORDER — OXYTOCIN-SODIUM CHLORIDE 30-0.9 UT/500ML-% IV SOLN
2.5000 [IU]/h | INTRAVENOUS | Status: AC
  Administered 2019-11-30: 2.5 [IU]/h via INTRAVENOUS
  Filled 2019-11-30: qty 500

## 2019-11-30 MED ORDER — OXYTOCIN-SODIUM CHLORIDE 30-0.9 UT/500ML-% IV SOLN: 2.5000 [IU]/h | INTRAVENOUS | Status: DC

## 2019-11-30 MED ORDER — CEFAZOLIN SODIUM-DEXTROSE 2-4 GM/100ML-% IV SOLN
2.0000 g | INTRAVENOUS | Status: AC
  Administered 2019-11-30: 2 g via INTRAVENOUS
  Filled 2019-11-30: qty 100

## 2019-11-30 MED ORDER — DIPHENHYDRAMINE HCL 50 MG/ML IJ SOLN: 12.5000 mg | INTRAMUSCULAR | Status: DC | PRN

## 2019-11-30 MED ORDER — DIPHENHYDRAMINE HCL 25 MG PO CAPS: 25.0000 mg | ORAL_CAPSULE | Freq: Four times a day (QID) | ORAL | Status: DC | PRN

## 2019-11-30 MED ORDER — TERBUTALINE SULFATE 1 MG/ML IJ SOLN
0.2500 mg | Freq: Once | INTRAMUSCULAR | Status: AC | PRN
  Administered 2019-11-30: 0.25 mg via SUBCUTANEOUS
  Filled 2019-11-30: qty 1

## 2019-11-30 MED ORDER — DIBUCAINE (PERIANAL) 1 % EX OINT: 1.0000 "application " | TOPICAL_OINTMENT | CUTANEOUS | Status: DC | PRN

## 2019-11-30 MED ORDER — OXYTOCIN BOLUS FROM INFUSION: 333.0000 mL | Freq: Once | INTRAVENOUS | Status: DC

## 2019-11-30 MED ORDER — PRENATAL MULTIVITAMIN CH
1.0000 | ORAL_TABLET | Freq: Every day | ORAL | Status: DC
  Administered 2019-12-01 – 2019-12-02 (×2): 1 via ORAL
  Filled 2019-11-30 (×2): qty 1

## 2019-11-30 MED ORDER — MISOPROSTOL 200 MCG PO TABS
ORAL_TABLET | ORAL | Status: AC
  Filled 2019-11-30: qty 4

## 2019-11-30 MED ORDER — FENTANYL CITRATE (PF) 100 MCG/2ML IJ SOLN
INTRAMUSCULAR | Status: DC | PRN
  Administered 2019-11-30 (×4): 50 ug via INTRAVENOUS

## 2019-11-30 MED ORDER — FENTANYL CITRATE (PF) 100 MCG/2ML IJ SOLN: 25.0000 ug | INTRAMUSCULAR | Status: DC | PRN

## 2019-11-30 MED ORDER — SIMETHICONE 80 MG PO CHEW
80.0000 mg | CHEWABLE_TABLET | ORAL | Status: DC | PRN
  Administered 2019-12-01 – 2019-12-02 (×2): 80 mg via ORAL
  Filled 2019-11-30 (×2): qty 1

## 2019-11-30 SURGICAL SUPPLY — 30 items
BAG COUNTER SPONGE EZ (MISCELLANEOUS) ×2 IMPLANT
CANISTER SUCT 3000ML PPV (MISCELLANEOUS) ×2 IMPLANT
CATH KIT ON-Q SILVERSOAK 5IN (CATHETERS) ×4 IMPLANT
CHLORAPREP W/TINT 26 (MISCELLANEOUS) ×4 IMPLANT
DERMABOND ADVANCED (GAUZE/BANDAGES/DRESSINGS) ×1
DERMABOND ADVANCED .7 DNX12 (GAUZE/BANDAGES/DRESSINGS) ×1 IMPLANT
DRSG OPSITE POSTOP 4X10 (GAUZE/BANDAGES/DRESSINGS) ×2 IMPLANT
DRSG TELFA 3X8 NADH (GAUZE/BANDAGES/DRESSINGS) ×2 IMPLANT
ELECT CAUTERY BLADE 6.4 (BLADE) ×2 IMPLANT
ELECT REM PT RETURN 9FT ADLT (ELECTROSURGICAL) ×2
ELECTRODE REM PT RTRN 9FT ADLT (ELECTROSURGICAL) ×1 IMPLANT
GAUZE SPONGE 4X4 12PLY STRL (GAUZE/BANDAGES/DRESSINGS) ×2 IMPLANT
GLOVE BIO SURGEON STRL SZ7 (GLOVE) ×2 IMPLANT
GLOVE INDICATOR 7.5 STRL GRN (GLOVE) ×2 IMPLANT
GOWN STRL REUS W/ TWL LRG LVL3 (GOWN DISPOSABLE) ×3 IMPLANT
GOWN STRL REUS W/TWL LRG LVL3 (GOWN DISPOSABLE) ×3
NS IRRIG 1000ML POUR BTL (IV SOLUTION) ×2 IMPLANT
PACK C SECTION (MISCELLANEOUS) ×2 IMPLANT
PAD OB MATERNITY 4.3X12.25 (PERSONAL CARE ITEMS) ×2 IMPLANT
PAD PREP 24X41 OB/GYN DISP (PERSONAL CARE ITEMS) ×2 IMPLANT
PENCIL SMOKE ULTRAEVAC 22 CON (MISCELLANEOUS) ×2 IMPLANT
STAPLER INSORB 30 2030 C-SECTI (MISCELLANEOUS) ×2 IMPLANT
STRIP CLOSURE SKIN 1/2X4 (GAUZE/BANDAGES/DRESSINGS) ×2 IMPLANT
SUCT VACUUM KIWI BELL (SUCTIONS) ×2 IMPLANT
SUT MNCRL AB 4-0 PS2 18 (SUTURE) ×2 IMPLANT
SUT PDS AB 1 TP1 96 (SUTURE) ×4 IMPLANT
SUT PLAIN 2 0 XLH (SUTURE) ×2 IMPLANT
SUT VIC AB 0 CTX 36 (SUTURE) ×2
SUT VIC AB 0 CTX36XBRD ANBCTRL (SUTURE) ×2 IMPLANT
SUT VIC AB 2-0 CT1 36 (SUTURE) ×4 IMPLANT

## 2019-11-30 NOTE — OB Triage Note (Signed)
Patient came in for observation for nausea/ vomiting and diarrhea since 2200. Patient denies uterine contractions and denies leaking of fluid. Patient denies vaginal bleeding and spotting. Patient reports +FM. Vital signs stable and patient afebrile. FHR baseline 130 with moderate variability with accelerations 15 x 15 and no decelerations. Significant other at bedside. Monitors applied and tracing.

## 2019-11-30 NOTE — Anesthesia Procedure Notes (Signed)
Epidural Patient location during procedure: OB Start time: 11/30/2019 10:38 AM End time: 11/30/2019 10:58 AM  Staffing Resident/CRNA: Nelda Marseille, CRNA Performed: resident/CRNA   Preanesthetic Checklist Completed: patient identified, IV checked, site marked, risks and benefits discussed, surgical consent, monitors and equipment checked, pre-op evaluation and timeout performed  Epidural Patient position: sitting Prep: Betadine Patient monitoring: heart rate, continuous pulse ox and blood pressure Approach: midline Location: L3-L4 Injection technique: LOR saline  Needle:  Needle type: Tuohy  Needle gauge: 17 G Needle length: 9 cm and 9 Catheter type: closed end flexible Catheter size: 20 Guage Test dose: negative and 1.5% lidocaine with Epi 1:200 K  Assessment Sensory level: T10 Events: blood not aspirated, injection not painful, no injection resistance, no paresthesia and negative IV test  Additional Notes   Patient tolerated the insertion well without complications.Reason for block:procedure for pain

## 2019-11-30 NOTE — Op Note (Signed)
Preoperative Diagnosis: 1) 29 y.o. G1P0 at [redacted]w[redacted]d with fetal intolerance to labor 2) Gestational hypertension  Postoperative Diagnosis: 1) 29 y.o. G1P1001 at [redacted]w[redacted]d with fetal intolerance to labor 2) Gestational hyptertension 3) Nuchal cord x 3 4) Meconium  Operation Performed: Primary low transverse C-section via pfannenstiel skin incision  Indication: Fetal intolerance to labor recurrent late decelerations  Anesthesia:Epidural  Primary Surgeon: Malachy Mood, MD  Assistant: Rod Can, CNM this surgery required a high level surgical assistant with none other readily available  Preoperative Antibiotics: 2g ancef  Estimated Blood Loss: 1000 mL  IV Fluids: 826mL  Drains or Tubes: Foley to gravity drainage, ON-Q catheter system  Implants: none  Specimens Removed: none  Complications: none  Intraoperative Findings:  Normal tubes ovaries and uterus.  There were several small subserosal fibroids noted as well as endometriosis implant on the posterior uterus. Delivery resulted in the birth of a liveborn female, APGAR (1 MIN): 8   APGAR (5 MINS): 9    Patient Condition: stable  Procedure in Detail:  Patient was taken to the operating room were she was administered regional anesthesia.  She was positioned in the supine position, prepped and draped in the  Usual sterile fashion.  Prior to proceeding with the case a time out was performed and the level of anesthetic was checked and noted to be adequate.  Utilizing the scalpel a pfannenstiel skin incision was made 2cm above the pubic symphysis and carried down sharply to the the level of the rectus fascia.  The fascia was incised in the midline using the scalpel and then extended using mayo scissors.  The superior border of the rectus fascia was grasped with two Kocher clamps and the underlying rectus muscles were dissected of the fascia using blunt dissection.  The median raphae was incised using Mayo scissors.   The inferior border  of the rectus fascia was dissected of the rectus muscles in a similar fashion.  The midline was identified, the peritoneum was entered bluntly and expanded using manual tractions.  The uterus was noted to be in a none rotated position.  Next the bladder blade was placed retracting the bladder caudally.  A bladder flap was not created.  A low transverse incision was scored on the lower uterine segment.  The hysterotomy was entered bluntly using the operators finger.  The hysterotomy incision was extended using manual traction.  The operators hand was placed within the hysterotomy position noting the fetus to be within the OA position.  The vertex was grasped, flexed, brought to the incision, There was difficulty in getting there vertex to delivery.  The rectus abdominis muscles were incised using a bandage scissors and a bell shaped vacuum was used with one pop off.  These interventions resulted in delivery of there vertex and the remainder of the body delivered with ease.  The infant was suctioned, cord was clamped and cut before handing off to the awaiting neonatologist.  The placenta was delivered using manual extraction.  The uterus was exteriorized, wiped clean of clots and debris using two moist laps.  The hysterotomy was closed using a two layer closure of 0 Vicryl, with the first being a running locked, the second a vertical imbricating.  The uterus was returned to the abdomen.  The peritoneal gutters were wiped clean of clots and debris using two moist laps.  The hysterotomy incision was re-inspected noted to be hemostatic.  The rectus muscles were re-approximated in the midline using a single 2-0 Vicryl mattress stitch.  The rectus muscles were inspected noted to be hemostatic.  The superior border of the rectus fascia was grasped with a Kocher clamp.  The ON-Q trocars were then placed 4cm above the superior border of the incision and tunneled subfascially.  The introducers were removed and the catheters  were threaded through the sleeves after which the sleeves were removed.  The fascia was closed using a looped #1 PDS in a running fashion taking 1cm by 1cm bites.  The subcutaneous tissue was irrigated using warm saline, hemostasis achieved using the bovie.  The subcutaneous dead space was greater than 3cm and was closed. The subcutaneous dead space was obliterated by using a 53-T 0 Chromic in a running fashion.     The skin was closed using Insorb staples.  Sponge needle and instrument counts were corrects times two.  The patient tolerated the procedure well and was taken to the recovery room in stable condition.

## 2019-11-30 NOTE — Progress Notes (Signed)
Labor Progress Note  Suzanne Romero is a 29 y.o. G1P0 at [redacted]w[redacted]d by LMP admitted for induction of labor due to Hypertension.  Subjective: Pt laying in bed, reported some cramping.    Objective: BP (!) 132/71 (BP Location: Right Arm)   Pulse 57   Temp 99.1 F (37.3 C) (Oral)   Resp 18   Ht 5\' 7"  (1.702 m)   Wt (!) 101.2 kg   LMP 02/26/2019   SpO2 99%   BMI 34.93 kg/m   Today's Vitals   11/30/19 0423 11/30/19 0610 11/30/19 0709 11/30/19 0721  BP: (!) 138/84 122/65 (!) 132/71   Pulse: 69 83 57   Resp:   18   Temp:  98.1 F (36.7 C) 99.1 F (37.3 C)   TempSrc:  Oral Oral   SpO2:      Weight:      Height:      PainSc:    2     Fetal Assessment: FHT:  FHR: 150 bpm, variability: moderate,  accelerations:  Abscent,  decelerations:  Present Late decels noted - bolus given, position changes made, Dr. Georgianne Fick reviewed strip about 0830. Category/reactivity:  Category II UC:   Irregular, mild SVE:    Dilation:   Effacement: 50%  Station:  Floating  Consistency: medium  Position: posterior  Membrane status: Intact Amniotic color: n/a  Labs: Lab Results  Component Value Date   WBC 12.7 (H) 11/29/2019   HGB 10.0 (L) 11/29/2019   HCT 31.9 (L) 11/29/2019   MCV 77.4 (L) 11/29/2019   PLT 247 11/29/2019    Assessment / Plan: Induction of labor due to gestational hypertension,  progressing well on pitocin  Labor: Cervical rippening continues with Cat II tracing -  will give Terb in an attempt to stop UCs Preeclampsia:  PCR 260, AST/ALT WNL, Plt 247 - this am BP range 122-148 / 57-88 Fetal Wellbeing:  Category II Pain Control:  Labor support without medications I/D:  Afebrile, GBS neg, Intact Anticipated MOD:  Unclear at this time  Clydene Laming, CNM 11/30/2019, 8:55 AM

## 2019-11-30 NOTE — Progress Notes (Signed)
Subjective:  Doing well no concerns.  Comfortable epidural in place  Objective:   Vitals: Blood pressure (!) 133/87, pulse 81, temperature 98.5 F (36.9 C), temperature source Oral, resp. rate 18, height 5\' 7"  (1.702 m), weight (!) 101.2 kg, last menstrual period 02/26/2019, SpO2 98 %. General: NAD Abdomen: gravid, non-tender Cervical Exam:  Dilation: Closed Effacement (%): 50 Cervical Position: Posterior Station: Ballotable Exam by:: [place]  FHT: 150, moderate, +accels, recurrent deceleration with at least some clearly late Toco: not picking up well  Results for orders placed or performed during the hospital encounter of 11/29/19 (from the past 24 hour(s))  SARS Coronavirus 2 by RT PCR (hospital order, performed in [place] hospital lab) Nasopharyngeal Nasopharyngeal Swab     Status: None   Collection Time: 11/29/19 10:35 PM   Specimen: Nasopharyngeal Swab  Result Value Ref Range   SARS Coronavirus 2 NEGATIVE NEGATIVE  CBC on admission     Status: Abnormal   Collection Time: 11/29/19 10:37 PM  Result Value Ref Range   WBC 12.7 (H) 4.0 - 10.5 K/uL   RBC 4.12 3.87 - 5.11 MIL/uL   Hemoglobin 10.0 (L) 12.0 - 15.0 g/dL   HCT 31.9 (L) 36 - 46 %   MCV 77.4 (L) 80.0 - 100.0 fL   MCH 24.3 (L) 26.0 - 34.0 pg   MCHC 31.3 30.0 - 36.0 g/dL   RDW 15.0 11.5 - 15.5 %   Platelets 247 150 - 400 K/uL   nRBC 0.0 0.0 - 0.2 %  Comprehensive metabolic panel     Status: Abnormal   Collection Time: 11/29/19 10:37 PM  Result Value Ref Range   Sodium 137 135 - 145 mmol/L   Potassium 3.8 3.5 - 5.1 mmol/L   Chloride 107 98 - 111 mmol/L   CO2 20 (L) 22 - 32 mmol/L   Glucose, Bld 82 70 - 99 mg/dL   BUN 9 6 - 20 mg/dL   Creatinine, Ser 0.55 0.44 - 1.00 mg/dL   Calcium 8.7 (L) 8.9 - 10.3 mg/dL   Total Protein 6.4 (L) 6.5 - 8.1 g/dL   Albumin 2.9 (L) 3.5 - 5.0 g/dL   AST 15 15 - 41 U/L   ALT 10 0 - 44 U/L   Alkaline Phosphatase 123 38 - 126 U/L   Total Bilirubin 0.5 0.3 - 1.2 mg/dL   GFR  calc non Af Amer >60 >60 mL/min   GFR calc Af Amer >60 >60 mL/min   Anion gap 10 5 - 15  Sample to Blood Bank     Status: None   Collection Time: 11/29/19 10:40 PM  Result Value Ref Range   Blood Bank Specimen SAMPLE AVAILABLE FOR TESTING    Sample Expiration      12/02/2019,2359 Performed at [facility], [place], [place] [address]   Protein / creatinine ratio, urine     Status: Abnormal   Collection Time: 11/29/19 11:19 PM  Result Value Ref Range   Creatinine, Urine 39 mg/dL   Total Protein, Urine 10 mg/dL   Protein Creatinine Ratio 0.26 (H) 0.00 - 0.15 mg/mg[Cre]    Assessment:   29 y.o. G1P0 [redacted]w[redacted]d IOL GHTN with fetal intolerance to labor  Plan:   1) Labor - no change since second cytotec  2) Fetus - continued category II tracing remote from delivery.  The patient was counseled regarding risk and benefits to proceeding with Cesarean section to expedite delivery.  Risk of cesarean section were  discussed including risk of bleeding and need for potential intraoperative or postoperative blood transfusion with a rate of approximately 5% quoted for all Cesarean sections, risk of injury to adjacent organs including but not limited to bowl and bladder, the need for additional surgical procedures to address such injuries, and the risk of infection.  The risk of continued attempts at vaginal delivery include but are note limited to worsening fetal or maternal status.  After consideration of options the patient is amenable to proceed with primary cesarean section for delivery.  3) GHTN - normotensive to mild range BP's   Malachy Mood, MD, [place], [facility] 11/30/2019, 11:57 AM

## 2019-11-30 NOTE — Anesthesia Procedure Notes (Signed)
Date/Time: 11/30/2019 1:02 PM Performed by: Nelda Marseille, CRNA Pre-anesthesia Checklist: Patient identified, Emergency Drugs available, Suction available, Patient being monitored and Timeout performed Oxygen Delivery Method: Nasal cannula

## 2019-11-30 NOTE — Transfer of Care (Signed)
Immediate Anesthesia Transfer of Care Note  Patient: Suzanne Romero  Procedure(s) Performed: CESAREAN SECTION (N/A )  Patient Location: L & D 1  Anesthesia Type:Epidural  Level of Consciousness: awake, alert  and oriented  Airway & Oxygen Therapy: Patient Spontanous Breathing  Post-op Assessment: Report given to RN and Post -op Vital signs reviewed and stable  Post vital signs: Reviewed and stable  Last Vitals:  Vitals Value Taken Time  BP    Temp 36.8 C 11/30/19 1345  Pulse    Resp 16 11/30/19 1345  SpO2 98 % 11/30/19 1345    Last Pain:  Vitals:   11/30/19 1000  TempSrc: Oral  PainSc:          Complications: No complications documented.

## 2019-11-30 NOTE — H&P (Signed)
OB History & Physical   History of Present Illness:  Chief Complaint:   HPI:  Suzanne Romero is a 29 y.o. G1P0 female at [redacted]w[redacted]d dated by LMP of 02/26/2019, c/w [place] at [redacted]w[redacted]d.  She presents to L&D for headache, nausea, and vomiting   Active FM Denies contractions, LOF, or vaginal bleeding Reports headache that started around 1800 with new onset n/v Denies RUQ or visual changes     Pregnancy Issues: 1. Conceived with fertility treatment  2. Prepregnancy BMI 30.6 3. History of heavy/abnormal uterine bleeding requiring blood transfusions  4. Anemia in pregnancy   Patient Active Problem List   Diagnosis Date Noted  . Gestational hypertension affecting first pregnancy 11/30/2019  . IBS (irritable bowel syndrome) 11/30/2019  . Nausea and vomiting during pregnancy 11/29/2019  . Abdominal pain affecting pregnancy 10/08/2019  . Encounter for supervision of normal first pregnancy in second trimester 05/21/2019  . Asthma without status asthmaticus 08/28/2013  . 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 Iron deficiency anemia 03/13/2013     Maternal Medical History:   Past Medical History:  Diagnosis Date  . Asthma     Past Surgical History:  Procedure Laterality Date  . HERNIA REPAIR      No Known Allergies  Prior to Admission medications   Medication Sig Start Date End Date Taking? Authorizing Provider  magnesium oxide (MAG-OX) 400 MG tablet Take 400 mg by mouth daily.   Yes [provider]  Prenatal Vit-Fe Fumarate-FA (PRENATAL MULTIVITAMIN) TABS tablet Take 1 tablet by mouth daily at 12 noon.   Yes [provider]     Prenatal care site:  [place] History: She  reports that she has never smoked. She has never used smokeless tobacco. She reports previous alcohol use. She reports that she does not use drugs.  Family History: family history includes Hypertension in her father and maternal grandfather; Stroke in her paternal grandmother.    Review of Systems: A full review of systems was performed and negative except as noted in the HPI.     Physical Exam:  Vital Signs: BP (!) 108/57   Pulse 55   Temp 98.3 F (36.8 C) (Oral)   Resp 19   Ht 5\' 7"  (1.702 m)   Wt (!) 101.2 kg   LMP 02/26/2019   SpO2 99%   BMI 34.93 kg/m   General: no acute distress.  HEENT: normocephalic, atraumatic Heart: regular rate & rhythm.  No murmurs/rubs/gallops Lungs: clear to auscultation bilaterally, normal respiratory effort Abdomen: soft, gravid, non-tender;  EFW: 7lbs  Pelvic:   External: Normal external female genitalia  Cervix: Dilation: Closed / Effacement (%): Thick / Station: Ballotable    Extremities: non-tender, symmetric, No edema bilaterally.  DTRs: 2+/2+  Neurologic: Alert & oriented x 3.    Results for orders placed or performed during the hospital encounter of 11/29/19 (from the past 24 hour(s))  SARS Coronavirus 2 by RT PCR (hospital order, performed in [facility] hospital lab) Nasopharyngeal Nasopharyngeal Swab     Status: None   Collection Time: 11/29/19 10:35 PM   Specimen: Nasopharyngeal Swab  Result Value Ref Range   SARS Coronavirus 2 NEGATIVE NEGATIVE  CBC on admission     Status: Abnormal   Collection Time: 11/29/19 10:37 PM  Result Value Ref Range   WBC 12.7 (H) 4.0 - 10.5 K/uL   RBC 4.12 3.87 - 5.11 MIL/uL   Hemoglobin 10.0 (L) 12.0 - 15.0 g/dL   HCT 31.9 (  L) 36 - 46 %   MCV 77.4 (L) 80.0 - 100.0 fL   MCH 24.3 (L) 26.0 - 34.0 pg   MCHC 31.3 30.0 - 36.0 g/dL   RDW 15.0 11.5 - 15.5 %   Platelets 247 150 - 400 K/uL   nRBC 0.0 0.0 - 0.2 %  Comprehensive metabolic panel     Status: Abnormal   Collection Time: 11/29/19 10:37 PM  Result Value Ref Range   Sodium 137 135 - 145 mmol/L   Potassium 3.8 3.5 - 5.1 mmol/L   Chloride 107 98 - 111 mmol/L   CO2 20 (L) 22 - 32 mmol/L   Glucose, Bld 82 70 - 99 mg/dL   BUN 9 6 - 20 mg/dL   Creatinine, Ser 0.55 0.44 - 1.00 mg/dL   Calcium 8.7 (L) 8.9 - 10.3 mg/dL    Total Protein 6.4 (L) 6.5 - 8.1 g/dL   Albumin 2.9 (L) 3.5 - 5.0 g/dL   AST 15 15 - 41 U/L   ALT 10 0 - 44 U/L   Alkaline Phosphatase 123 38 - 126 U/L   Total Bilirubin 0.5 0.3 - 1.2 mg/dL   GFR calc non Af Amer >60 >60 mL/min   GFR calc Af Amer >60 >60 mL/min   Anion gap 10 5 - 15  Protein / creatinine ratio, urine     Status: Abnormal   Collection Time: 11/29/19 11:19 PM  Result Value Ref Range   Creatinine, Urine 39 mg/dL   Total Protein, Urine 10 mg/dL   Protein Creatinine Ratio 0.26 (H) 0.00 - 0.15 mg/mg[Cre]    Pertinent Results:  Prenatal Labs: Blood type/Rh A Pos  Antibody screen neg  Rubella Immune  Varicella Immune  RPR NR  HBsAg Neg  HIV NR  GC neg  Chlamydia neg  Genetic screening negative  1 hour GTT 90  3 hour GTT N/A  GBS Neg   Flu vaccine given 01/23/2019 Tdap vaccine given 09/12/2019  FHT:  140bpm, moderate variability, accels present, decels absent  TOCO: quiet  SVE:  Dilation: Closed / Effacement (%): Thick / Station: Microbiologist by [employer]   Assessment:  Suzanne Romero is a 29 y.o. G1P0 female at [redacted]w[redacted]d with gestational hypertension.   Plan:  1. Admit to Labor & Delivery; consents reviewed and obtained - Admission screening for QASTM-19 per unit policy  - Dr. Kenton Kingfisher notified of admission   2. Fetal Well being  - Fetal Tracing: cat 1 - Group B Streptococcus ppx not indicated - GBS ne - Presentation: cephalic confirmed by Leopold's    3. Routine OB: - Prenatal labs reviewed, as above - Rh pos - CBC, T&S, RPR on admit - Clear fluids, IVF  4. Induction of Labor -  External toco in place -  Pelvis adequate for trial of labor  -  Plan for induction with misoprostol - will do buccal misoprostol d/t painful pelvic exams  -  Plan for continuous fetal monitoring  -  Maternal pain control as desired - History of heavy menstrual bleeding requiring blood transfusion   - Plan to have hemorrhage meds ready at bedside  - Anticipate  vaginal delivery  5. Post Partum Planning: - Infant feeding: Breast  - Contraception: LNG IUD   Minda Meo, CNM 11/30/19 4:16 AM  Drinda Butts, CNM Certified Nurse Midwife [facility]

## 2019-11-30 NOTE — Progress Notes (Signed)
Labor Progress Note  Suzanne Romero is a 29 y.o. G1P0 at [redacted]w[redacted]d by LMP admitted for induction of labor due to Hypertension.  Subjective: Pt laying in bed, comfortable with epidural   Objective: BP (!) 133/87   Pulse 81   Temp 98.5 F (36.9 C) (Oral)   Resp 18   Ht 5\' 7"  (1.702 m)   Wt (!) 101.2 kg   LMP 02/26/2019   SpO2 98%   BMI 34.93 kg/m   Today's Vitals   11/30/19 0721 11/30/19 0924 11/30/19 1000 11/30/19 1111  BP:  (!) 136/73  (!) 133/87  Pulse:  69  81  Resp:   18   Temp:   98.5 F (36.9 C)   TempSrc:   Oral   SpO2:    98%  Weight:      Height:      PainSc: 2        Fetal Assessment: FHT:  FHR: 150 bpm, variability: moderate,  accelerations:  Present,  decelerations:  Present recurrent decels, likely late in nature Category/reactivity:  Category II UC:   Irritability SVE:    Dilation: Closed  Effacement: 50%  Station:  Floating  Consistency: medium  Position: posterior  Membrane status: Intact Amniotic color: n/a  Labs: Lab Results  Component Value Date   WBC 12.7 (H) 11/29/2019   HGB 10.0 (L) 11/29/2019   HCT 31.9 (L) 11/29/2019   MCV 77.4 (L) 11/29/2019   PLT 247 11/29/2019    Assessment / Plan: Induction of labor due to gestational hypertension,  progressing well on pitocin  Labor: Cervical rippening lead to Cat II tracing -  Dr. Shelda Pal called and will be here at 1200 for c/s Preeclampsia:  PCR 260, AST/ALT WNL, Plt 247 - this am BP range 130s / 70s-80s Fetal Wellbeing:  Category II Pain Control:  Epidural I/D:  Afebrile, GBS neg, Intact Anticipated MOD:  C/s  [place], CNM 11/30/2019, 11:43 AM

## 2019-11-30 NOTE — Lactation Note (Signed)
This note was copied from a baby's chart. Lactation Consultation Note  Patient Name: Suzanne Romero [id number] Date: 11/30/2019 Reason for consult: Initial assessment;Primapara;1st time breastfeeding;Term   Maternal Data Formula Feeding for Exclusion: No Has patient been taught Hand Expression?: Yes Does the patient have breastfeeding experience prior to this delivery?: No  Feeding Feeding Type: Breast Fed Latched easily to first breast, right, In cradle hold, after approx 15 min, came off breast, dusky, gasping and choking, sat up, stimulated and then began to cry, color gradually pinked up, once pink, baby began rooting and was placed on left  Breast in football hold and nursed 20 min with no more episodes LATCH Score Latch: Grasps breast easily, tongue down, lips flanged, rhythmical sucking.  Audible Swallowing: Spontaneous and intermittent  Type of Nipple: Everted at rest and after stimulation  Comfort (Breast/Nipple): Soft / non-tender  Hold (Positioning): Full assist, staff holds infant at breast  LATCH Score: 8  Interventions Interventions: Breast feeding basics reviewed;Assisted with latch;Skin to skin;Hand express;Breast compression;Support pillows  Lactation Tools Discussed/Used WIC Program: No   Consult Status Consult Status: Follow-up Date: 12/01/19 Follow-up type: In-patient    Suzanne Romero 11/30/2019, 3:19 PM

## 2019-11-30 NOTE — Anesthesia Preprocedure Evaluation (Signed)
Anesthesia Evaluation  Patient identified by MRN, date of birth, ID band Patient awake    Reviewed: Allergy & Precautions, H&P , NPO status , Patient's Chart, lab work & pertinent test results  History of Anesthesia Complications Negative for: history of anesthetic complications  Airway Mallampati: II  TM Distance: >3 FB Neck ROM: full    Dental no notable dental hx.    Pulmonary neg pulmonary ROS,    Pulmonary exam normal        Cardiovascular negative cardio ROS Normal cardiovascular exam     Neuro/Psych negative neurological ROS  negative psych ROS   GI/Hepatic negative GI ROS, Neg liver ROS,   Endo/Other  negative endocrine ROS  Renal/GU negative Renal ROS  negative genitourinary   Musculoskeletal   Abdominal   Peds  Hematology negative hematology ROS (+)   Anesthesia Other Findings   Reproductive/Obstetrics (+) Pregnancy                             Anesthesia Physical Anesthesia Plan  ASA: II  Anesthesia Plan: Epidural   Post-op Pain Management:    Induction:   PONV Risk Score and Plan:   Airway Management Planned:   Additional Equipment:   Intra-op Plan:   Post-operative Plan:   Informed Consent: I have reviewed the patients History and Physical, chart, labs and discussed the procedure including the risks, benefits and alternatives for the proposed anesthesia with the patient or authorized representative who has indicated his/her understanding and acceptance.       Plan Discussed with: CRNA  Anesthesia Plan Comments:         Anesthesia Quick Evaluation

## 2019-12-01 ENCOUNTER — Encounter: Payer: Self-pay | Admitting: Obstetrics and Gynecology

## 2019-12-01 LAB — CBC
HCT: 24.5 % — ABNORMAL LOW (ref 36.0–46.0)
Hemoglobin: 8 g/dL — ABNORMAL LOW (ref 12.0–15.0)
MCH: 24.5 pg — ABNORMAL LOW (ref 26.0–34.0)
MCHC: 32.7 g/dL (ref 30.0–36.0)
MCV: 75.2 fL — ABNORMAL LOW (ref 80.0–100.0)
Platelets: 209 10*3/uL (ref 150–400)
RBC: 3.26 MIL/uL — ABNORMAL LOW (ref 3.87–5.11)
RDW: 15.3 % (ref 11.5–15.5)
WBC: 11.3 10*3/uL — ABNORMAL HIGH (ref 4.0–10.5)
nRBC: 0 % (ref 0.0–0.2)

## 2019-12-01 LAB — RPR: RPR Ser Ql: NONREACTIVE

## 2019-12-01 MED ORDER — FERROUS SULFATE 325 (65 FE) MG PO TABS
325.0000 mg | ORAL_TABLET | Freq: Two times a day (BID) | ORAL | Status: DC
  Administered 2019-12-01 – 2019-12-03 (×4): 325 mg via ORAL
  Filled 2019-12-01 (×4): qty 1

## 2019-12-01 NOTE — Lactation Note (Signed)
This note was copied from a baby's chart. Lactation Consultation Note  Patient Name: Suzanne Romero [id number] Date: 12/01/2019 Reason for consult: Follow-up assessment;Mother's request;Primapara;Term (Finally fed great on right breast as well - ? infertility)  When entered room for this feeding, FOB was changing void and Suzanne Romero was rooting trying to suck on his hands.  Mom reports Suzanne Romero has breast fed well on the left breast since birth, but does not want the right breast.  Hand expressed lots of colostrum from both breasts.  Breasts felt full and mom voiced feeling heavier.  Suzanne Romero latched to the right breast with minimal assistance and began strong, rhythmic sucking with frequent swallows with about every suck.  When he started to fall asleep, demonstrated how to massage breast to keep him actively sucking and swallowing at the breast.  He sucked vigorously for 12 minutes before coming off the breast on his own.  Mom reports this was the best feeding so far on the right breast.  Once mom sat him up to burp, he started rooting again.  Mom put him to the left breast where he breast fed for another 15 minutes.  Mom voices nipple tenderness on left breast where he has been feeding the most and longest.  Demonstrated hand expression of milk to rub on nipples to lubricate, prevent infection and for comfort.  Coconut oil given with instructions in use.  Mom already has her Spectra pump through her [employer].  As new parents, they had lots of questions about when and how to begin pumping to return to work, when to know his stomach was upset over something mom ate, how to know he was getting enough milk, etc which were addressed.  Reviewed normal newborn stomach size, supply and demand, feeding cues, adequate intake and out put, normal course of lactation and routine newborn feeding patterns.  Lactation Geophysicist/field seismologist given and reviewed.  Lactation name and number written on white board and encouraged  to call with any questions, concerns or assistance.   Maternal Data Formula Feeding for Exclusion: No Has patient been taught Hand Expression?: Yes Does the patient have breastfeeding experience prior to this delivery?: No (Gr1 - Has adopted 30 year old)  Feeding Feeding Type: Breast Fed  LATCH Score Latch: Repeated attempts needed to sustain latch, nipple held in mouth throughout feeding, stimulation needed to elicit sucking reflex.  Audible Swallowing: Spontaneous and intermittent  Type of Nipple: Everted at rest and after stimulation  Comfort (Breast/Nipple): Filling, red/small blisters or bruises, mild/mod discomfort  Hold (Positioning): Assistance needed to correctly position infant at breast and maintain latch.  LATCH Score: 7  Interventions Interventions: Breast feeding basics reviewed;Assisted with latch;Skin to skin;Breast massage;Hand express;Breast compression;Adjust position;Support pillows;Position options;Coconut oil  Lactation Tools Discussed/Used Tools: Coconut oil WIC Program: No ([place]) Pump Review: Milk Storage;Other (comment)   Consult Status Consult Status: PRN Follow-up type: Call as needed    [name] 12/01/2019, 6:32 PM

## 2019-12-01 NOTE — Anesthesia Postprocedure Evaluation (Signed)
Anesthesia Post Note  Patient: Suzanne Romero  Procedure(s) Performed: CESAREAN SECTION (N/A )  Patient location during evaluation: Mother Baby Anesthesia Type: Epidural Level of consciousness: awake and alert Pain management: pain level controlled Vital Signs Assessment: post-procedure vital signs reviewed and stable Respiratory status: spontaneous breathing, nonlabored ventilation and respiratory function stable Cardiovascular status: stable Postop Assessment: no headache, no backache, epidural receding and able to ambulate Anesthetic complications: no   No complications documented.   Last Vitals:  Vitals:   12/01/19 0700 12/01/19 0747  BP:  (!) 136/66  Pulse: 84 85  Resp:  18  Temp:  37.1 C  SpO2: 95% 99%    Last Pain:  Vitals:   12/01/19 0900  TempSrc:   PainSc: 0-No pain                 Brett Canales Toniette Devera

## 2019-12-01 NOTE — Progress Notes (Signed)
Post Partum Day 1  Subjective: Doing well, no complaints.  Tolerating regular diet, pain with PO meds, voiding and ambulating without difficulty.  No CP SOB Fever,Chills, N/V or leg pain; denies nipple or breast pain, no HA change of vision, RUQ/epigastric pain  Objective: BP (!) 136/66 (BP Location: Right Arm)   Pulse 85   Temp 98.7 F (37.1 C) (Oral)   Resp 18   Ht 5\' 7"  (1.702 m)   Wt (!) 101.2 kg   LMP 02/26/2019   SpO2 99%   Breastfeeding Unknown   BMI 34.93 kg/m    Physical Exam:  General: NAD Breasts: soft/nontender CV: RRR Pulm: nl effort, CTABL Abdomen: soft, NT, BS x 4 Incision: Honeycomb Dsg, no erythema or drainage; On-Q pump noted in place.  Lochia: small Uterine Fundus: fundus firm and 1 fb below umbilicus DVT Evaluation: no cords, ttp LEs   Recent Labs    11/29/19 2237 12/01/19 0546  HGB 10.0* 8.0*  HCT 31.9* 24.5*  WBC 12.7* 11.3*  PLT 247 209    Assessment/Plan: 29 y.o. G1P1001 postpartum day # 1  - Continue routine PP care - Lactation consult prn  - Acute blood loss anemia due to surgery - hemodynamically stable and asymptomatic; start po ferrous sulfate BID with stool softeners  - Immunization status: all Imms up to date    Disposition: Does not desire Dc home today.     [place], CNM 12/01/2019  10:00 AM

## 2019-12-02 NOTE — Lactation Note (Signed)
This note was copied from a baby's chart. Lactation Consultation Note  Patient Name: Suzanne Romero [id number] Date: 12/02/2019 Reason for consult: Follow-up assessment;Mother's request;Primapara;NICU baby;Term;Other (Comment) (Tx to SCN for Tachynpea last night - may go back to rm today)  Assisted mom with pillow support and nursing stool sitting in the chair in SCN with [name] in modified cross cradle hold skin to skin on left breast.  Mom had infertility issues and had adopted a now 30 year old.  Demonstrated that she could still easily hand express colostrum.  He latched with minimal assistance and began strong, rhythmic sucking with audible swallows.  He nursed for 23 minutes before coming off satiated.  Left breast was softer.  SATs and vital signs remained within normal limits through out entire feeding.  [name] may go back to room with mom today.  Praised mom for coming to the SCN to continue to breast feed her baby through the night and this morning especially since she had a C/S.  Mom's nipples are tender and red this small amount of trauma.  She already has coconut oil.  Comfort gels given after she returned to her room and instructed in alternating use with coconut oil.  Mom loved the way the comfort gels felt.  Encouraged mom to call when further assistance needed.    Maternal Data Formula Feeding for Exclusion: No Has patient been taught Hand Expression?: Yes Does the patient have breastfeeding experience prior to this delivery?: No  Feeding Feeding Type: Breast Fed  LATCH Score Latch: Repeated attempts needed to sustain latch, nipple held in mouth throughout feeding, stimulation needed to elicit sucking reflex.  Audible Swallowing: Spontaneous and intermittent  Type of Nipple: Everted at rest and after stimulation  Comfort (Breast/Nipple): Filling, red/small blisters or bruises, mild/mod discomfort  Hold (Positioning): Assistance needed to correctly position infant at breast and  maintain latch.  LATCH Score: 7  Interventions Interventions: Assisted with latch;Skin to skin;Breast massage;Hand express;Breast compression;Adjust position;Support pillows;Position options;Coconut oil;Comfort gels  Lactation Tools Discussed/Used WIC Program: No [employer]) Pump Review: Setup, frequency, and cleaning;Milk Storage;Other (comment) Initiated by:: Set up by night nurse Date initiated:: 12/02/19   Consult Status Consult Status: Follow-up Date: 12/02/19 Follow-up type: Call as needed    [name] 12/02/2019, 11:18 AM

## 2019-12-02 NOTE — Plan of Care (Signed)
Assessment VSS. Alert and oriented with aprop. Affect. Ambulating with slow steady gait. Positive B.S and Pt. States she had a normal B.M. this afternoon. Fundus is firm with scant Lochia. States pain is a '1" prior to scheduled dose of Ibuprofen. Pt. States this is intermittent abdominal cramping. Lower Incision with Honeycomb dressing D&I. On Q infusing without s/s complications. Pt. Demonstrating ability to care for herself and Infant. Denies c/o.

## 2019-12-02 NOTE — Progress Notes (Signed)
Patient educated about breast stimulation while infant is in SCN. Patient requested to pump. Nurse educated patient on hospital pump and started patient pumping. Patient stated understanding. Will continue with plan of care.

## 2019-12-02 NOTE — Progress Notes (Signed)
Post Partum Day 2  Subjective: Doing well, no complaints.  Tolerating regular diet, pain with PO meds, voiding and ambulating without difficulty.  No CP SOB Fever,Chills, N/V or leg pain; denies nipple or breast pain, no HA change of vision, RUQ/epigastric pain  Infant was in SCN overnight with concerns about tachypnea, plan for him to return to rooming in today and work on feeding/monitor closely.   Objective: BP (!) 106/60 (BP Location: Right Arm)   Pulse 81   Temp 98.2 F (36.8 C) (Oral)   Resp 20   Ht 5\' 7"  (1.702 m)   Wt (!) 101.2 kg   LMP 02/26/2019   SpO2 99%   Breastfeeding Unknown   BMI 34.93 kg/m    Physical Exam:  General: NAD Breasts: soft/nontender CV: RRR Pulm: nl effort, CTABL Abdomen: soft, NT, BS x 4 Incision: Honeycomb Dsg, no erythema or drainage; On-Q pump noted in place.  Lochia: small Uterine Fundus: fundus firm and 1 fb below umbilicus DVT Evaluation: no cords, ttp LEs   Recent Labs    11/29/19 2237 12/01/19 0546  HGB 10.0* 8.0*  HCT 31.9* 24.5*  WBC 12.7* 11.3*  PLT 247 209    Assessment/Plan: 29 y.o. G1P1001 postpartum day # 2  - Continue routine PP care, remain inpatient due to concerns for newborn and need to keep breastfeeding dyad together.  - Lactation consult prn  - Acute blood loss anemia due to surgery - hemodynamically stable and asymptomatic; continue po ferrous sulfate BID with stool softeners  - Immunization status: all Imms up to date    Disposition: Does not desire Dc home today.     Francetta Found, CNM 12/02/2019  9:49 AM

## 2019-12-02 NOTE — Discharge Summary (Signed)
Obstetrical Discharge Summary  Patient Name: Suzanne Romero DOB: [birth year] MRN: [id number]  Date of Admission: 11/29/2019 Date of Delivery: 11/30/19 Delivered by: Dr Georgianne Fick Date of Discharge: 12/03/2019  Primary OB: [facility] WUJ:[id number] last menstrual period was 02/26/2019. EDC Estimated Date of Delivery: 12/03/19 Gestational Age at Delivery: [redacted]w[redacted]d   Antepartum complications:  1. Conceived with fertility treatment  2. Prepregnancy BMI 30.6 3. History of heavy/abnormal uterine bleeding requiring blood transfusions  4. Anemia in pregnancy   Admitting Diagnosis: Gestational hypertension Secondary Diagnosis: pLTCS for fetal intolerance  Patient Active Problem List   Diagnosis Date Noted  . Gestational hypertension affecting first pregnancy 11/30/2019  . IBS (irritable bowel syndrome) 11/30/2019  . Fetal intolerance to labor, delivered, current hospitalization   . Nuchal cord affecting delivery   . Nausea and vomiting during pregnancy 11/29/2019  . Abdominal pain affecting pregnancy 10/08/2019  . Encounter for supervision of normal first pregnancy in second trimester 05/21/2019  . Asthma without status asthmaticus 08/28/2013  . Bleeding disorder ([place]) 03/13/2013  . Iron deficiency anemia 03/13/2013    Augmentation: Cytotec Complications: None Intrapartum complications/course: fetal intolerance, see Op note Date of Delivery: 11/30/19 Delivered By: Dr Georgianne Fick Delivery Type: primary cesarean section, low transverse incision Anesthesia: epidural Placenta: manual Laceration: none Episiotomy: none Newborn Data: Live born female "Suzanne Romero" Birth Weight: 6 lb 0.3 oz (2730 g) APGAR: 8, 9  Newborn Delivery   Birth date/time: 11/30/2019 12:47:00 Delivery type: C-Section, Low Transverse C-section categorization: Primary      Postpartum Procedures: none  Edinburgh:  Edinburgh Postnatal Depression Scale Screening Tool 11/30/2019  I have been able to laugh and see the  funny side of things. 0  I have looked forward with enjoyment to things. 0  I have blamed myself unnecessarily when things went wrong. 0  I have been anxious or worried for no good reason. 1  I have felt scared or panicky for no good reason. 0  Things have been getting on top of me. 0  I have been so unhappy that I have had difficulty sleeping. 0  I have felt sad or miserable. 0  I have been so unhappy that I have been crying. 0  The thought of harming myself has occurred to me. 0  Edinburgh Postnatal Depression Scale Total 1      Post partum course-Cesarean Section:  Patient had an uncomplicated postpartum course.  By time of discharge on POD#3, her pain was controlled on oral pain medications; she had appropriate lochia and was ambulating, voiding without difficulty, tolerating regular diet and passing flatus.   She was deemed stable for discharge to home.    Discharge Physical Exam:  BP (!) 123/89 (BP Location: Left Arm)   Pulse 80   Temp 98 F (36.7 C) (Oral)   Resp 18   Ht 5\' 7"  (1.702 m)   Wt (!) 101.2 kg   LMP 02/26/2019   SpO2 99%   Breastfeeding Unknown   BMI 34.93 kg/m   General: NAD CV: RRR Pulm: CTABL, nl effort ABD: s/nd/nt, fundus firm and below the umbilicus Lochia: moderate Incision: c/d/i, healing well, no significant drainage, no dehiscence, no significant erythema DVT Evaluation: LE non-ttp, no evidence of DVT on exam.  Hemoglobin  Date Value Ref Range Status  12/01/2019 8.0 (L) 12.0 - 15.0 g/dL Final    Comment:    Reticulocyte Hemoglobin testing may be clinically indicated, consider ordering this additional test [id number]    HCT  Date Value Ref Range Status  12/01/2019 24.5 (L) 36 - 46 % Final     Disposition: stable, discharge to home. Baby Feeding: breastmilk Baby Disposition: home with mom  Rh Immune globulin given: n/a Rubella vaccine given: immune Varicella vaccine given: immune Tdap vaccine given in AP or PP setting: 09/12/19 Flu  vaccine given in AP or PP setting: 01/23/19  Contraception: Mirena IUD  Prenatal Labs:  Blood type/Rh A Pos  Antibody screen neg  Rubella Immune  Varicella Immune  RPR NR  HBsAg Neg  HIV NR  GC neg  Chlamydia neg  Genetic screening negative  1 hour GTT 90  3 hour GTT N/A  GBS Neg      Plan:  Tishana Clinkenbeard was discharged to home in good condition. Follow-up appointment with delivering provider in 2 weeks.  Discharge Medications: Allergies as of 12/03/2019   No Known Allergies     Medication List    STOP taking these medications   promethazine 25 MG tablet Commonly known as: PHENERGAN     TAKE these medications   acetaminophen 500 MG tablet Commonly known as: TYLENOL Take 2 tablets (1,000 mg total) by mouth every 6 (six) hours as needed for mild pain or moderate pain.   ferrous sulfate 325 (65 FE) MG tablet Take 1 tablet (325 mg total) by mouth 2 (two) times daily with a meal.   ibuprofen 800 MG tablet Commonly known as: ADVIL Take 1 tablet (800 mg total) by mouth every 8 (eight) hours.   magnesium oxide 400 MG tablet Commonly known as: MAG-OX Take 400 mg by mouth daily.   oxyCODONE 5 MG immediate release tablet Commonly known as: Oxy IR/ROXICODONE Take 1 tablet (5 mg total) by mouth every 4 (four) hours as needed for up to 7 days for severe pain.   pantoprazole 20 MG tablet Commonly known as: PROTONIX Take 20 mg by mouth daily.   Prenatal 28-0.8 MG Tabs Take 1 tablet by mouth daily.        Follow-up Information    Ward, Honor Loh, MD. Go on 12/07/2019.   Specialty: Obstetrics and Gynecology Why: Wound check @ 0945 am Contact information: [place] [address] [phone]               Signed:  Minda Meo, CNM 12/03/2019  4:59 PM  Drinda Butts, CNM Certified Nurse Midwife [facility]

## 2019-12-03 ENCOUNTER — Ambulatory Visit: Payer: Self-pay

## 2019-12-03 MED ORDER — IBUPROFEN 800 MG PO TABS: 800.0000 mg | ORAL_TABLET | Freq: Three times a day (TID) | ORAL | 3 refills | Status: DC

## 2019-12-03 MED ORDER — OXYCODONE HCL 5 MG PO TABS: 5.0000 mg | ORAL_TABLET | ORAL | 0 refills | Status: AC | PRN

## 2019-12-03 MED ORDER — ACETAMINOPHEN 500 MG PO TABS
1000.0000 mg | ORAL_TABLET | Freq: Four times a day (QID) | ORAL | 2 refills | Status: AC | PRN
Start: 2019-12-03 — End: 2020-12-02

## 2019-12-03 MED ORDER — FERROUS SULFATE 325 (65 FE) MG PO TABS: 325.0000 mg | ORAL_TABLET | Freq: Two times a day (BID) | ORAL | 3 refills | Status: DC

## 2019-12-03 NOTE — Discharge Instructions (Signed)

## 2019-12-03 NOTE — Progress Notes (Signed)
Patient discharged home with infant. Discharge instructions and prescriptions given and reviewed with patient. Patient verbalized understanding. Escorted out by auxillary.  

## 2019-12-03 NOTE — Lactation Note (Signed)
This note was copied from a baby's chart. Lactation Consultation Note  Patient Name: Suzanne Romero [id number] Date: 12/03/2019   Mom latching [name] on her own.  Mom has contact numbers for lactation. Encouraged to call with any questions, concerns or assistance.     Maternal Data    Feeding    LATCH Score                   Interventions    Lactation Tools Discussed/Used     Consult Status      [name] 12/03/2019, 5:47 PM

## 2021-09-10 IMAGING — US US RENAL
1 series · 14 of 25 positions shown · non-contrast
Comparison: None.

CLINICAL DATA: Right flank pain

EXAM:
RENAL / URINARY TRACT ULTRASOUND COMPLETE

[Series 1: us renal · 14 of 36 slices shown]
[im 1/36]
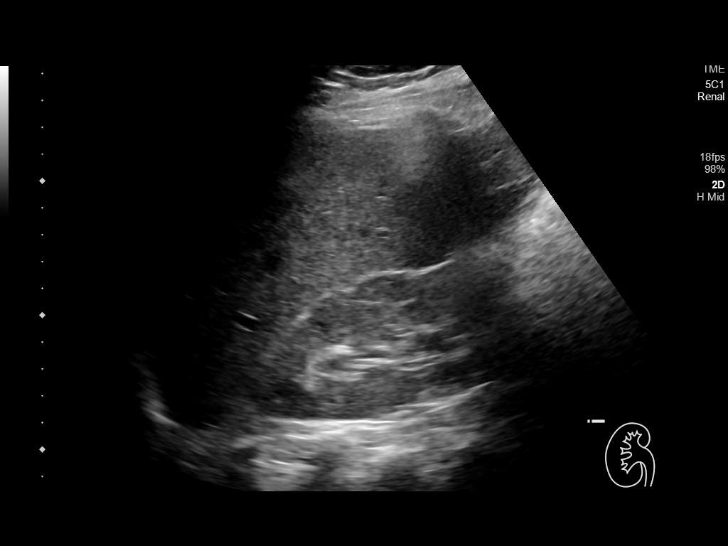
[im 3/36]
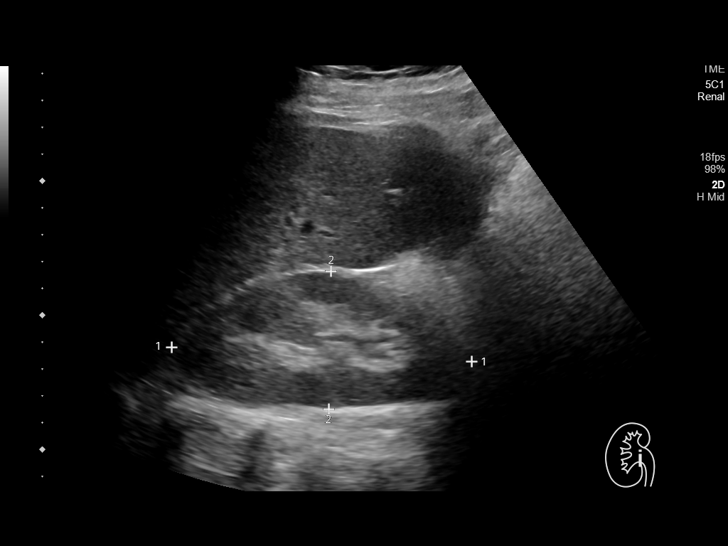
[im 6/36]
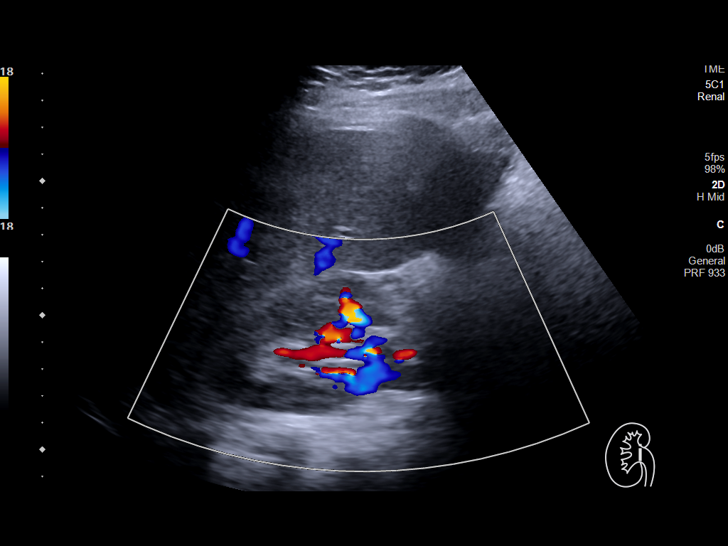
[im 9/36]
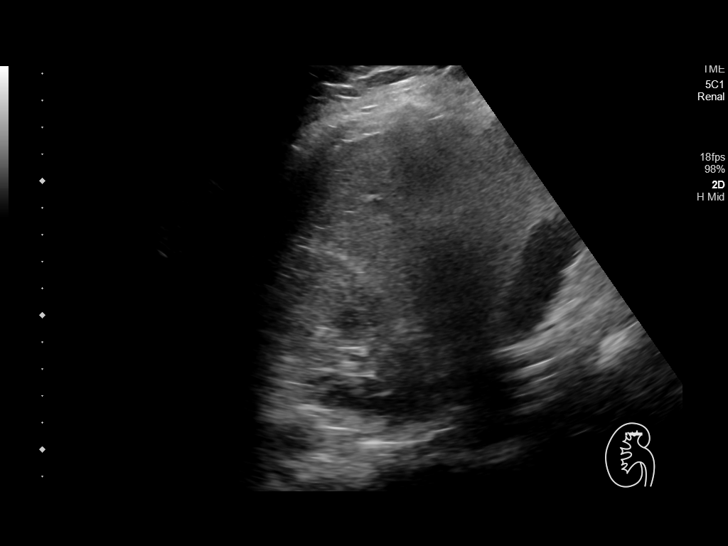
[im 12/36]
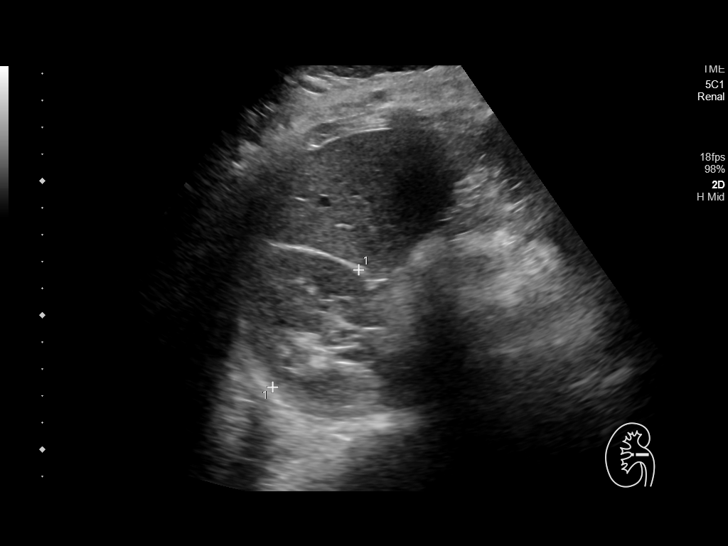
[im 14/36]
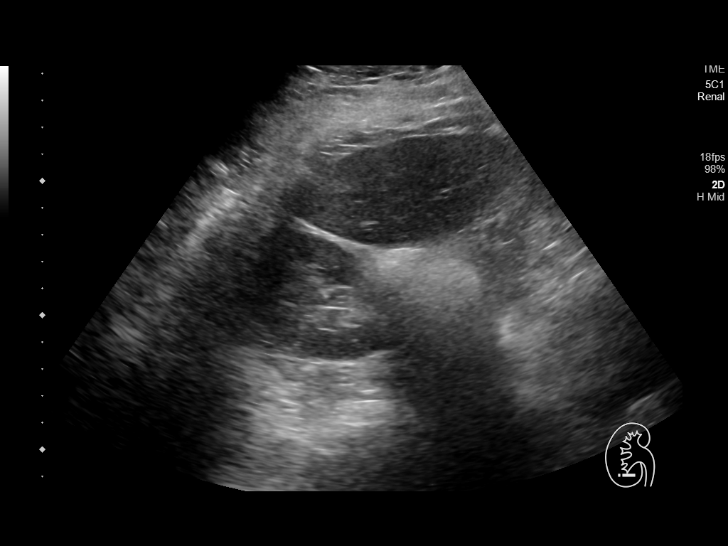
[im 17/36]
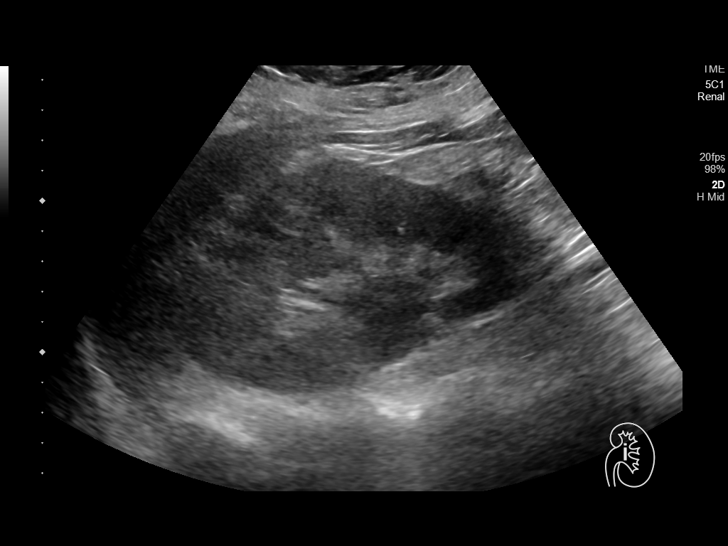
[im 19/36]
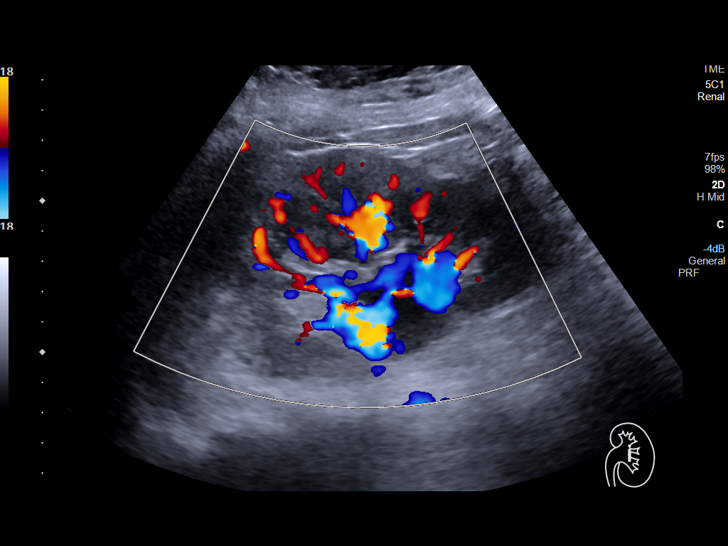
[im 22/36]
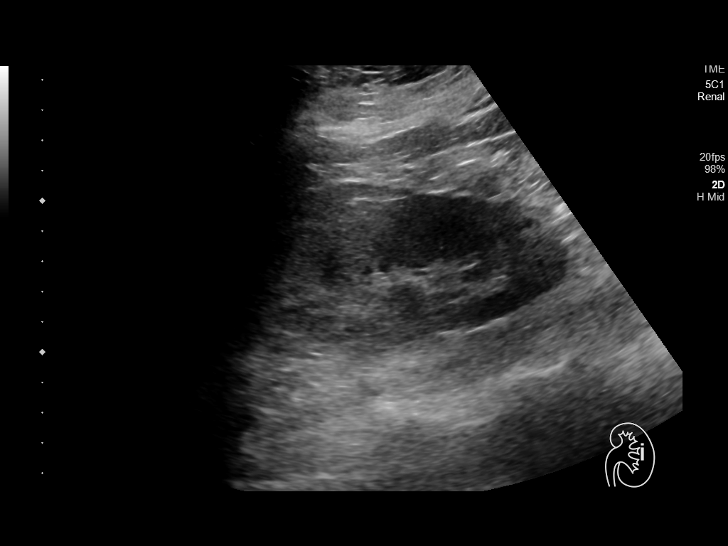
[im 24/36]
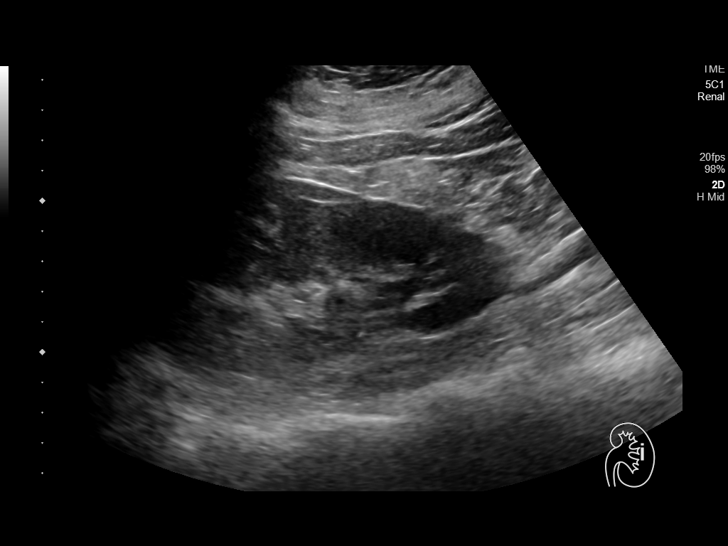
[im 27/36]
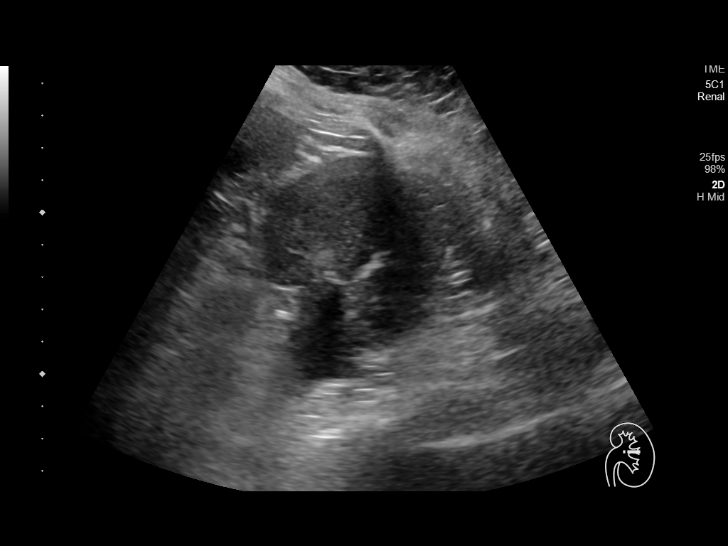
[im 30/36]
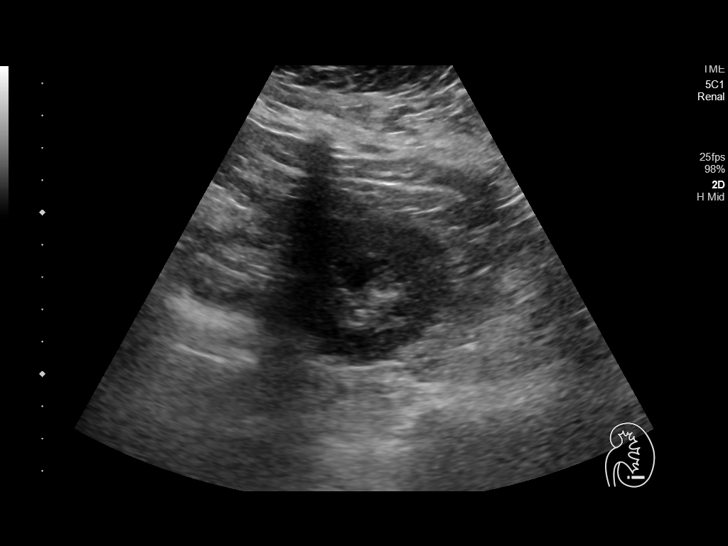
[im 33/36]
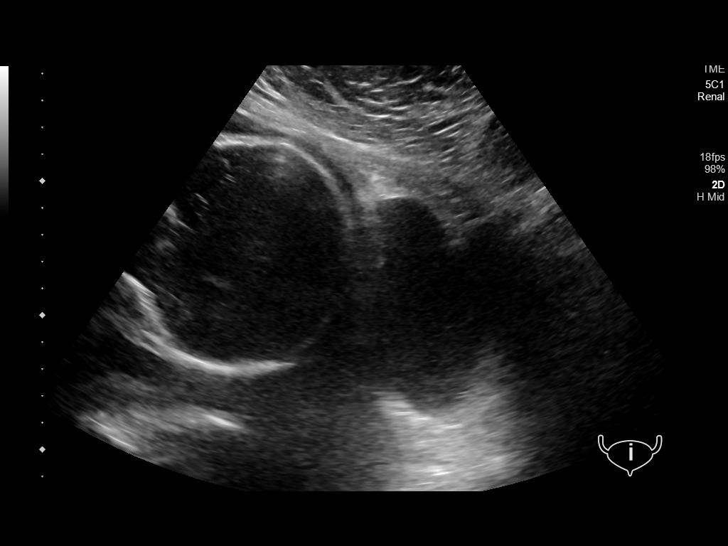
[im 36/36]
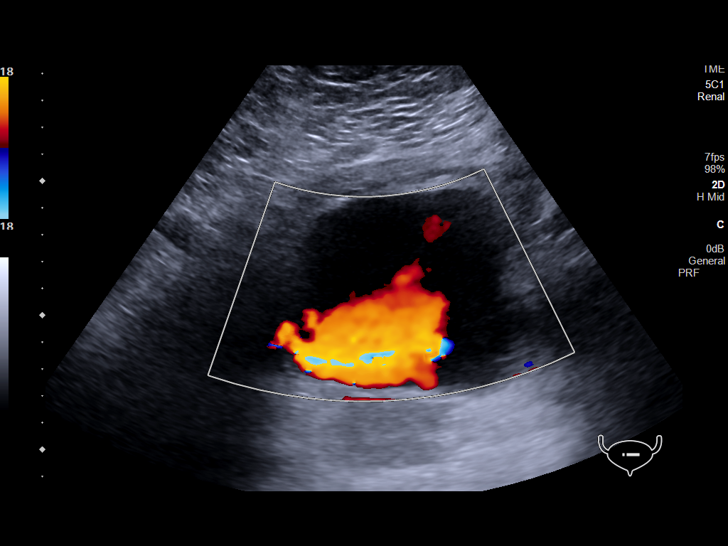

[14 of 25 positions shown; findings below may reference images not displayed]

FINDINGS: Right Kidney:

Renal measurements: 11 x 5 x 5 cm = volume: 163 mL . Echogenicity
within normal limits. No mass or hydronephrosis visualized.

Left Kidney:

Renal measurements: 12.5 x 7 x 6 cm = volume: 263 mL. Echogenicity
within normal limits. Pelviectasis. Fortunately this is the
asymptomatic side.

Bladder:

Appears normal for degree of bladder distention. Bilateral jets were
seen.
IMPRESSION: No hydronephrosis or other explanation for right flank pain.
Bilateral ureteral jets were seen.

## 2023-01-25 ENCOUNTER — Other Ambulatory Visit: Payer: Self-pay | Admitting: Obstetrics

## 2023-01-25 NOTE — Progress Notes (Signed)
Maternal iron deficiency anemia with a hgb of 9.7 at 29.6 weeks  Chari Manning CNM

## 2023-01-26 ENCOUNTER — Ambulatory Visit
Admission: RE | Admit: 2023-01-26 | Discharge: 2023-01-26 | Disposition: A | Discharge status: Home / Self Care | Payer: 59 | Source: Ambulatory Visit | Attending: Obstetrics | Admitting: Obstetrics

## 2023-01-26 DIAGNOSIS — Z3A29 29 weeks gestation of pregnancy: Secondary | ICD-10-CM | POA: Diagnosis not present

## 2023-01-26 DIAGNOSIS — O99013 Anemia complicating pregnancy, third trimester: Secondary | ICD-10-CM | POA: Diagnosis present

## 2023-01-26 MED ORDER — IRON SUCROSE 500 MG IVPB - SIMPLE MED
500.0000 mg | INTRAVENOUS | Status: DC
  Administered 2023-01-26: 500 mg via INTRAVENOUS
  Filled 2023-01-26: qty 500

## 2023-03-09 NOTE — H&P (Signed)
OB History & Physical   History of Present Illness:  Chief Complaint: pre-op visit for repeat c-section  HPI:  Suzanne Romero is a 32 y.o. G2P1001 female at [redacted]w[redacted]d dated by a first trimester ultrasound.  Her pregnancy has been complicated by chronic hypertension, asthma, obesity, and a history of c-section.    She denies contractions.   She denies leakage of fluid.   She denies vaginal bleeding.   She reports fetal movement.    Total weight gain for pregnancy: 3.9 kg (8 lb 9.6 oz)   Obstetrical Problem List: 2 Problems (from 09/10/22 to present)     No problems associated with this episode.        Maternal Medical History:   Past Medical History:  Diagnosis Date   Anemia    Asthma without status asthmaticus (HHS-HCC)    exercise induced   Bleeding disorder (CMS-HCC) 03/13/2013   Possible disorder - Evaluated and managed by [facility] [place] A. Peri Jefferson, M.D.   Encounter for supervision of high-risk pregnancy with history of infertility in third trimester (HHS-HCC)    32 y.o. G1P0 at  Last Menstrual Period 02/26/19 consistent with ultrasound @ [redacted]w[redacted]d on 04/16/19 .Estimated Date of Delivery: 12/04/2019.  Sex of baby and name: boy "Marvis Moeller"                                   FOB:  Josh   Factors complicating this pregnancy  Covid Vaccination  Will get second dose 3/15  Conceived with fertility treatment  Ovulation induction, trigger shot, and IUI  Prepregnancy BMI 30.6    Encounter for supervision of normal first pregnancy in second trimester (HHS-HCC)    Endometriosis of uterus    GERD (gastroesophageal reflux disease) 2001   IBS (irritable bowel syndrome)    Infertility management    Uterine size-date discrepancy in third trimester (HHS-HCC)     Past Surgical History:  Procedure Laterality Date   HERNIA REPAIR  02/01/2007   Umbilical hernia with mesh in [facility] in [place], [place]   CESAREAN SECTION     COLPOSCOPY      No Known Allergies  Prior to  Admission medications   Medication Sig Taking? Last Dose  albuterol 90 mcg/actuation inhaler Inhale 2 inhalations into the lungs every 6 (six) hours as needed. Yes Taking  cyanocobalamin (VITAMIN B12) 1,000 mcg/mL injection Inject IM once weekly x4 then monthly x 3 Yes Taking  doxylamine succinate/vit B6 (DICLEGIS ORAL) Take by mouth Yes Taking  ergocalciferol, vitamin D2, 1,250 mcg (50,000 unit) capsule Take 1 capsule (50,000 Units total) by mouth once a week Yes Taking  NIFEdipine (PROCARDIA-XL) 60 MG (OSM) XL tablet Take 1 tablet (60 mg total) by mouth once daily Yes Taking  prenatal vitamin-iron-FA (PRENATE PLUS) tablet Take 1 tablet by mouth once daily Yes Taking    OB History  Gravida Para Term Preterm AB Living  2 1 1     1   SAB IAB Ectopic Molar Multiple Live Births            1    # Outcome Date GA Lbr Len/2nd Weight Sex Type Anes PTL Lv  2 Current           1 Term 11/30/19 [redacted]w[redacted]d  2.73 kg (6 lb 0.3 oz) M CS-LTranv EPI  LIV    Prenatal care site: [facility]  Social History: She  reports that  she has never smoked. She has never used smokeless tobacco. She reports that she does not currently use alcohol after a past usage of about 2.0 standard drinks of alcohol per week. She reports that she does not use drugs.  Family History: family history includes High blood pressure (Hypertension) in her father and maternal grandfather; Stroke in her paternal grandfather.    Review of Systems  Constitutional: Negative.   HENT: Negative.    Eyes: Negative.   Respiratory: Negative.    Cardiovascular: Negative.   Gastrointestinal: Negative.   Genitourinary: Negative.   Musculoskeletal: Negative.   Skin: Negative.   Neurological: Negative.   Endo/Heme/Allergies: Negative.   Psychiatric/Behavioral: Negative.       Physical Exam:  BP 137/86   Pulse 89   Ht 172.7 cm (5\' 8" )   Wt (!) 104 kg (229 lb 3.2 oz)   LMP  (LMP Unknown)   BMI 34.85 kg/m   Physical  Exam Constitutional:      General: She is not in acute distress.    Appearance: Normal appearance.  HENT:     Head: Normocephalic and atraumatic.  Eyes:     General: No scleral icterus.    Conjunctiva/sclera: Conjunctivae normal.  Cardiovascular:     Rate and Rhythm: Normal rate and regular rhythm.     Heart sounds: No murmur heard.    No friction rub. No gallop.  Pulmonary:     Effort: Pulmonary effort is normal. No respiratory distress.     Breath sounds: Normal breath sounds. No wheezing, rhonchi or rales.  Abdominal:     General: Bowel sounds are normal. There is no distension.     Palpations: Abdomen is soft. There is mass (gravid, NT).     Tenderness: There is no abdominal tenderness. There is no guarding or rebound.  Musculoskeletal:        General: No swelling. Normal range of motion.  Neurological:     General: No focal deficit present.     Mental Status: She is oriented to person, place, and time.     Cranial Nerves: No cranial nerve deficit.  Skin:    General: Skin is warm and dry.     Findings: No lesion.  Psychiatric:        Mood and Affect: Mood normal.        Behavior: Behavior normal.        Judgment: Judgment normal.  Vitals and nursing note reviewed.      Pertinent Results:  Prenatal Labs Blood type/Rh A positive  Antibody screen negative  Rubella Immune  Varicella Immune    RPR NR  HBsAg negative  HepC negative  HIV negative  GC negative  Chlamydia negative  Genetic screening NIPS negative  1 hour GTT Early: 100, 28 weeks: 133  3 hour GTT N/a  GBS pending   Assessment:  Suzanne Romero is a 32 y.o. G2P1001 female at [redacted]w[redacted]d with history of cesarean delivery, desires repeat, chronic hypertension (well-controlled for now on medication).   Plan:  Admit to Labor & Delivery  CBC, T&S, NPO, IVF GBS pending.   Consents signed today   Rubye Oaks, MD 03/09/2023 8:04 AM

## 2023-03-16 ENCOUNTER — Encounter
Admission: RE | Admit: 2023-03-16 | Discharge: 2023-03-16 | Disposition: A | Discharge status: Home / Self Care | Payer: 59 | Source: Ambulatory Visit | Attending: Obstetrics and Gynecology | Admitting: Obstetrics and Gynecology

## 2023-03-16 VITALS — Ht 68.0 in | Wt 227.1 lb

## 2023-03-16 DIAGNOSIS — I10 Essential (primary) hypertension: Secondary | ICD-10-CM

## 2023-03-16 DIAGNOSIS — Z01810 Encounter for preprocedural cardiovascular examination: Secondary | ICD-10-CM

## 2023-03-16 HISTORY — DX: Irritable bowel syndrome, unspecified: K58.9

## 2023-03-16 HISTORY — DX: Endometriosis, unspecified: N80.9

## 2023-03-16 HISTORY — DX: Gestational (pregnancy-induced) hypertension without significant proteinuria, unspecified trimester: O13.9

## 2023-03-16 HISTORY — DX: Gastro-esophageal reflux disease without esophagitis: K21.9

## 2023-03-16 HISTORY — DX: Iron deficiency anemia, unspecified: D50.9

## 2023-03-16 NOTE — Patient Instructions (Addendum)
Your procedure is scheduled on:03-23-23 Wednesday  Arrival Time: 7:15 AM Please call Labor and Delivery if you have any questions [phone].  Arrival: If your arrival time is prior to 6:00 am, please enter through the Emergency Room Entrance and you will be directed to Labor and Delivery. If your arrival time is 6:00 am or later, please enter the Medical Mall and follow the greeter's instructions.  REMEMBER: Instructions that are not followed completely may result in serious medical risk, up to and including death; or upon the discretion of your surgeon and anesthesiologist your surgery may need to be rescheduled.  Do not eat food OR drink any liquids after midnight the night before surgery.  No gum chewing or hard candies.  One week prior to surgery:Stop NOW (03-16-23) Stop Anti-inflammatories (NSAIDS) such as Advil, Aleve, Ibuprofen, Motrin, Naproxen, Naprosyn and Aspirin based products such as Excedrin, Goody's Powder, BC Powder  You may however, continue to take Tylenol if needed for pain up until the day of surgery  Continue your 81 mg Aspirin up until the day prior to surgery-Do NOT take the day of surgery   Continue taking all of your other prescription medications up until the day of surgery.  ON THE DAY OF SURGERY ONLY TAKE THESE MEDICATIONS WITH SIPS OF WATER: -NIFEdipine (PROCARDIA-XL)  Bring your Albuterol Inhaler to the hospital   No Alcohol for 24 hours before or after surgery.  No Smoking including e-cigarettes for 24 hours prior to surgery.  No chewable tobacco products for at least 6 hours prior to surgery.  No nicotine patches on the day of surgery.  Do not use any "recreational" drugs for at least a week prior to your surgery.  Please be advised that the combination of cocaine and anesthesia may have negative outcomes, up to and including death. If you test positive for cocaine, your surgery will be cancelled.  On the morning of surgery brush your teeth with  toothpaste and water, you may rinse your mouth with mouthwash if you wish. Do not swallow any toothpaste or mouthwash.  Use CHG soap as directed on instruction sheet (Avoid Nipple and Private area)  Do not wear jewelry, make-up, hairpins, clips or nail polish.  For welded (permanent) jewelry: bracelets, anklets, waist bands, etc.  Please have this removed prior to surgery.  If it is not removed, there is a chance that hospital personnel will need to cut it off on the day of surgery.  Do not wear lotions, powders, or perfumes.   Do not shave body hair from the neck down 48 hours before surgery.  Contact lenses, hearing aids and dentures may not be worn into surgery.  Do not bring valuables to the hospital. [facility] is not responsible for any missing/lost belongings or valuables.    Notify your doctor if there is any change in your medical condition (cold, fever, infection).  Wear comfortable clothing (specific to your surgery type) to the hospital.  After surgery, you can help prevent lung complications by doing breathing exercises.  Take deep breaths and cough every 1-2 hours. Your doctor may order a device called an Incentive Spirometer to help you take deep breaths. When coughing or sneezing, hold a pillow firmly against your incision with both hands. This is called "splinting." Doing this helps protect your incision. It also decreases belly discomfort.  Please call the Pre-admissions Testing Dept. at [phone] if you have any questions about these instructions.  Surgery Visitation Policy:  Visitor Passes  All visitors, including children, need an identification sticker when visiting. These stickers must be worn where they can be seen.   Labor & Delivery  Laboring women may have one designated support person and two other visitors of any age visit. The support person must remain the same. The visitors may switch with other visitors. Visitation is permitted 24 hours  per day. The designated support person or a visitor over the age of 16 may sleep overnight in the patient's room. A doula registered with [facility] for labor and delivery support is not considered a visitor. Doulas not registered with [facility] are considered visitors.  Mother Baby Unit, OB Specialty and Gynecological Care  A designated support person and three visitors of any age may visit. The three visitors may switch out. The designated support person or a visitor age 44 or older may stay overnight in the room. During the postpartum period (up to 6 weeks), if the mother is the patient, she can have her newborn stay with her if there is another support person present who can be responsible for the baby.       Preparing for Surgery with CHLORHEXIDINE GLUCONATE (CHG) Soap  Chlorhexidine Gluconate (CHG) Soap  o An antiseptic cleaner that kills germs and bonds with the skin to continue killing germs even after washing  o Used for showering the night before surgery and morning of surgery  Before surgery, you can play an important role by reducing the number of germs on your skin.  CHG (Chlorhexidine gluconate) soap is an antiseptic cleanser which kills germs and bonds with the skin to continue killing germs even after washing.  Please do not use if you have an allergy to CHG or antibacterial soaps. If your skin becomes reddened/irritated stop using the CHG.  1. Shower the NIGHT BEFORE SURGERY and the MORNING OF SURGERY with CHG soap.  2. If you choose to wash your hair, wash your hair first as usual with your normal shampoo.  3. After shampooing, rinse your hair and body thoroughly to remove the shampoo.  4. Use CHG as you would any other liquid soap. You can apply CHG directly to the skin and wash gently with a scrungie or a clean washcloth.  5. Apply the CHG soap to your body only from the neck down. Do not use on open wounds or open sores. Avoid contact with your eyes, ears,  mouth, and genitals (private parts). Wash face and genitals (private parts) with your normal soap.  6. Wash thoroughly, paying special attention to the area where your surgery will be performed.  7. Thoroughly rinse your body with warm water.  8. Do not shower/wash with your normal soap after using and rinsing off the CHG soap.  9. Pat yourself dry with a clean towel.  10. Wear clean pajamas to bed the night before surgery.  12. Place clean sheets on your bed the night of your first shower and do not sleep with pets.  13. Shower again with the CHG soap on the day of surgery prior to arriving at the hospital.  14. Do not apply any deodorants/lotions/powders.  15. Please wear clean clothes to the hospital.

## 2023-03-21 ENCOUNTER — Encounter
Admission: RE | Admit: 2023-03-21 | Discharge: 2023-03-21 | Disposition: A | Discharge status: Home / Self Care | Payer: 59 | Source: Ambulatory Visit | Attending: Obstetrics and Gynecology | Admitting: Obstetrics and Gynecology

## 2023-03-21 DIAGNOSIS — Z01818 Encounter for other preprocedural examination: Secondary | ICD-10-CM | POA: Insufficient documentation

## 2023-03-21 DIAGNOSIS — Z98891 History of uterine scar from previous surgery: Secondary | ICD-10-CM

## 2023-03-21 DIAGNOSIS — Z01810 Encounter for preprocedural cardiovascular examination: Secondary | ICD-10-CM

## 2023-03-21 DIAGNOSIS — I10 Essential (primary) hypertension: Secondary | ICD-10-CM | POA: Insufficient documentation

## 2023-03-21 LAB — TYPE AND SCREEN
ABO/RH(D): A POS
Antibody Screen: NEGATIVE
Extend sample reason: UNDETERMINED

## 2023-03-22 LAB — RPR: RPR Ser Ql: NONREACTIVE

## 2023-03-22 MED ORDER — LACTATED RINGERS IV SOLN: Freq: Once | INTRAVENOUS | Status: AC

## 2023-03-22 MED ORDER — EPINEPHRINE PF 1 MG/ML IJ SOLN: 0.3000 mg | Freq: Once | INTRAMUSCULAR | Status: DC | PRN

## 2023-03-22 MED ORDER — METHYLPREDNISOLONE SODIUM SUCC 125 MG IJ SOLR: 125.0000 mg | Freq: Once | INTRAMUSCULAR | Status: DC | PRN

## 2023-03-22 MED ORDER — DIPHENHYDRAMINE HCL 50 MG/ML IJ SOLN: 25.0000 mg | Freq: Once | INTRAMUSCULAR | Status: DC | PRN

## 2023-03-22 MED ORDER — SODIUM CHLORIDE 0.9 % IV SOLN: INTRAVENOUS | Status: DC | PRN

## 2023-03-22 MED ORDER — SODIUM CHLORIDE 0.9 % IV BOLUS: 500.0000 mL | Freq: Once | INTRAVENOUS | Status: DC | PRN

## 2023-03-22 MED ORDER — CEFAZOLIN SODIUM-DEXTROSE 2-4 GM/100ML-% IV SOLN
2.0000 g | INTRAVENOUS | Status: DC
  Filled 2023-03-22: qty 100

## 2023-03-22 MED ORDER — BUPIVACAINE HCL (PF) 0.5 % IJ SOLN
5.0000 mL | Freq: Once | INTRAMUSCULAR | Status: DC
  Filled 2023-03-22: qty 10

## 2023-03-22 MED ORDER — LACTATED RINGERS IV SOLN: Freq: Once | INTRAVENOUS | Status: DC

## 2023-03-22 MED ORDER — SOD CITRATE-CITRIC ACID 500-334 MG/5ML PO SOLN: 30.0000 mL | ORAL | Status: AC

## 2023-03-22 MED ORDER — ALBUTEROL SULFATE (2.5 MG/3ML) 0.083% IN NEBU: 2.5000 mg | INHALATION_SOLUTION | Freq: Once | RESPIRATORY_TRACT | Status: DC | PRN

## 2023-03-23 ENCOUNTER — Encounter: Payer: Self-pay | Admitting: Obstetrics and Gynecology

## 2023-03-23 ENCOUNTER — Inpatient Hospital Stay
Admission: RE | Admit: 2023-03-23 | Discharge: 2023-03-25 | DRG: 787 | Disposition: A | Discharge status: Home / Self Care | Payer: 59 | Attending: Obstetrics | Admitting: Obstetrics

## 2023-03-23 ENCOUNTER — Other Ambulatory Visit: Payer: Self-pay

## 2023-03-23 ENCOUNTER — Inpatient Hospital Stay: Payer: 59 | Admitting: Anesthesiology

## 2023-03-23 ENCOUNTER — Inpatient Hospital Stay: Payer: Self-pay | Admitting: Urgent Care

## 2023-03-23 ENCOUNTER — Encounter
Admission: RE | Disposition: A | Discharge status: Home / Self Care | Payer: Self-pay | Source: Home / Self Care | Attending: Obstetrics and Gynecology

## 2023-03-23 DIAGNOSIS — K219 Gastro-esophageal reflux disease without esophagitis: Secondary | ICD-10-CM | POA: Diagnosis present

## 2023-03-23 DIAGNOSIS — O10919 Unspecified pre-existing hypertension complicating pregnancy, unspecified trimester: Secondary | ICD-10-CM

## 2023-03-23 DIAGNOSIS — D62 Acute posthemorrhagic anemia: Secondary | ICD-10-CM | POA: Diagnosis not present

## 2023-03-23 DIAGNOSIS — J45909 Unspecified asthma, uncomplicated: Secondary | ICD-10-CM | POA: Diagnosis present

## 2023-03-23 DIAGNOSIS — O9962 Diseases of the digestive system complicating childbirth: Secondary | ICD-10-CM | POA: Diagnosis present

## 2023-03-23 DIAGNOSIS — O34219 Maternal care for unspecified type scar from previous cesarean delivery: Secondary | ICD-10-CM | POA: Diagnosis present

## 2023-03-23 DIAGNOSIS — O9952 Diseases of the respiratory system complicating childbirth: Secondary | ICD-10-CM | POA: Diagnosis present

## 2023-03-23 DIAGNOSIS — Z823 Family history of stroke: Secondary | ICD-10-CM

## 2023-03-23 DIAGNOSIS — Z98891 History of uterine scar from previous surgery: Principal | ICD-10-CM

## 2023-03-23 DIAGNOSIS — O34211 Maternal care for low transverse scar from previous cesarean delivery: Principal | ICD-10-CM | POA: Diagnosis present

## 2023-03-23 DIAGNOSIS — Z8249 Family history of ischemic heart disease and other diseases of the circulatory system: Secondary | ICD-10-CM | POA: Diagnosis not present

## 2023-03-23 DIAGNOSIS — O9081 Anemia of the puerperium: Secondary | ICD-10-CM | POA: Diagnosis not present

## 2023-03-23 DIAGNOSIS — O1092 Unspecified pre-existing hypertension complicating childbirth: Secondary | ICD-10-CM | POA: Diagnosis present

## 2023-03-23 DIAGNOSIS — Z3A38 38 weeks gestation of pregnancy: Secondary | ICD-10-CM | POA: Diagnosis not present

## 2023-03-23 DIAGNOSIS — Z8616 Personal history of COVID-19: Secondary | ICD-10-CM | POA: Diagnosis not present

## 2023-03-23 DIAGNOSIS — O99214 Obesity complicating childbirth: Secondary | ICD-10-CM | POA: Diagnosis present

## 2023-03-23 LAB — COMPREHENSIVE METABOLIC PANEL
ALT: 9 U/L (ref 0–44)
AST: 13 U/L — ABNORMAL LOW (ref 15–41)
Albumin: 2.9 g/dL — ABNORMAL LOW (ref 3.5–5.0)
Alkaline Phosphatase: 103 U/L (ref 38–126)
Anion gap: 10 (ref 5–15)
BUN: 6 mg/dL (ref 6–20)
CO2: 18 mmol/L — ABNORMAL LOW (ref 22–32)
Calcium: 8.9 mg/dL (ref 8.9–10.3)
Chloride: 109 mmol/L (ref 98–111)
Creatinine, Ser: 0.52 mg/dL (ref 0.44–1.00)
GFR, Estimated: >60 mL/min (ref >60)
Glucose, Bld: 72 mg/dL (ref 70–99)
Potassium: 3.7 mmol/L (ref 3.5–5.1)
Sodium: 137 mmol/L (ref 135–145)
Total Bilirubin: 0.5 mg/dL (ref <1.2)
Total Protein: 6.3 g/dL — ABNORMAL LOW (ref 6.5–8.1)

## 2023-03-23 LAB — CBC
HCT: 32.8 % — ABNORMAL LOW (ref 36.0–46.0)
Hemoglobin: 10.6 g/dL — ABNORMAL LOW (ref 12.0–15.0)
MCH: 23.7 pg — ABNORMAL LOW (ref 26.0–34.0)
MCHC: 32.3 g/dL (ref 30.0–36.0)
MCV: 73.4 fL — ABNORMAL LOW (ref 80.0–100.0)
Platelets: 242 10*3/uL (ref 150–400)
RBC: 4.47 MIL/uL (ref 3.87–5.11)
RDW: 18.2 % — ABNORMAL HIGH (ref 11.5–15.5)
WBC: 10.5 10*3/uL (ref 4.0–10.5)
nRBC: 0 % (ref 0.0–0.2)

## 2023-03-23 LAB — PROTEIN / CREATININE RATIO, URINE
Creatinine, Urine: 66 mg/dL
Protein Creatinine Ratio: 0.17 mg/mg{creat} — ABNORMAL HIGH (ref 0.00–0.15)
Total Protein, Urine: 11 mg/dL

## 2023-03-23 LAB — ABO/RH: ABO/RH(D): A POS

## 2023-03-23 SURGERY — Surgical Case
Anesthesia: Spinal

## 2023-03-23 MED ORDER — OXYTOCIN-SODIUM CHLORIDE 30-0.9 UT/500ML-% IV SOLN
INTRAVENOUS | Status: DC | PRN
  Administered 2023-03-23: 30 [IU] via INTRAVENOUS

## 2023-03-23 MED ORDER — OXYCODONE-ACETAMINOPHEN 5-325 MG PO TABS
2.0000 | ORAL_TABLET | ORAL | Status: DC | PRN
Start: 2023-03-24 — End: 2023-03-25

## 2023-03-23 MED ORDER — LIDOCAINE HCL (PF) 1 % IJ SOLN
INTRAMUSCULAR | Status: DC | PRN
  Administered 2023-03-23: 3 mL via SUBCUTANEOUS

## 2023-03-23 MED ORDER — PHENYLEPHRINE HCL-NACL 20-0.9 MG/250ML-% IV SOLN
INTRAVENOUS | Status: AC
  Filled 2023-03-23: qty 250

## 2023-03-23 MED ORDER — OXYCODONE HCL 5 MG PO TABS: 5.0000 mg | ORAL_TABLET | ORAL | Status: AC | PRN

## 2023-03-23 MED ORDER — PHENYLEPHRINE HCL-NACL 20-0.9 MG/250ML-% IV SOLN
INTRAVENOUS | Status: DC | PRN
  Administered 2023-03-23: 30 ug/min via INTRAVENOUS

## 2023-03-23 MED ORDER — OXYCODONE-ACETAMINOPHEN 5-325 MG PO TABS
1.0000 | ORAL_TABLET | ORAL | Status: DC | PRN
  Administered 2023-03-24: 1 via ORAL
  Filled 2023-03-23: qty 1

## 2023-03-23 MED ORDER — BUPIVACAINE HCL (PF) 0.5 % IJ SOLN
INTRAMUSCULAR | Status: DC | PRN
  Administered 2023-03-23: 10 mL

## 2023-03-23 MED ORDER — FENTANYL CITRATE (PF) 100 MCG/2ML IJ SOLN
INTRAMUSCULAR | Status: AC
  Filled 2023-03-23: qty 2

## 2023-03-23 MED ORDER — SOD CITRATE-CITRIC ACID 500-334 MG/5ML PO SOLN
ORAL | Status: AC
  Administered 2023-03-23: 30 mL via ORAL
  Filled 2023-03-23: qty 15

## 2023-03-23 MED ORDER — MORPHINE SULFATE (PF) 0.5 MG/ML IJ SOLN
INTRAMUSCULAR | Status: AC
  Filled 2023-03-23: qty 10

## 2023-03-23 MED ORDER — ONDANSETRON HCL 4 MG/2ML IJ SOLN
INTRAMUSCULAR | Status: DC | PRN
  Administered 2023-03-23: 4 mg via INTRAVENOUS

## 2023-03-23 MED ORDER — FENTANYL CITRATE (PF) 100 MCG/2ML IJ SOLN
INTRAMUSCULAR | Status: DC | PRN
  Administered 2023-03-23: 15 ug via INTRATHECAL

## 2023-03-23 MED ORDER — KETOROLAC TROMETHAMINE 30 MG/ML IJ SOLN
INTRAMUSCULAR | Status: DC | PRN
  Administered 2023-03-23: 30 mg via INTRAVENOUS

## 2023-03-23 MED ORDER — SENNOSIDES-DOCUSATE SODIUM 8.6-50 MG PO TABS
2.0000 | ORAL_TABLET | ORAL | Status: DC
  Administered 2023-03-23 – 2023-03-24 (×2): 2 via ORAL
  Filled 2023-03-23 (×2): qty 2

## 2023-03-23 MED ORDER — SIMETHICONE 80 MG PO CHEW
80.0000 mg | CHEWABLE_TABLET | Freq: Three times a day (TID) | ORAL | Status: DC
  Administered 2023-03-23 – 2023-03-25 (×6): 80 mg via ORAL
  Filled 2023-03-23 (×6): qty 1

## 2023-03-23 MED ORDER — SCOPOLAMINE 1 MG/3DAYS TD PT72
1.0000 | MEDICATED_PATCH | Freq: Once | TRANSDERMAL | Status: DC
  Administered 2023-03-23: 1.5 mg via TRANSDERMAL
  Filled 2023-03-23: qty 1

## 2023-03-23 MED ORDER — NALOXONE HCL 0.4 MG/ML IJ SOLN: 0.4000 mg | INTRAMUSCULAR | Status: DC | PRN

## 2023-03-23 MED ORDER — IBUPROFEN 600 MG PO TABS
600.0000 mg | ORAL_TABLET | Freq: Four times a day (QID) | ORAL | Status: DC
  Administered 2023-03-24 – 2023-03-25 (×4): 600 mg via ORAL
  Filled 2023-03-23 (×4): qty 1

## 2023-03-23 MED ORDER — DIPHENHYDRAMINE HCL 25 MG PO CAPS
25.0000 mg | ORAL_CAPSULE | Freq: Four times a day (QID) | ORAL | Status: DC | PRN
Start: 2023-03-23 — End: 2023-03-25

## 2023-03-23 MED ORDER — OXYTOCIN-SODIUM CHLORIDE 30-0.9 UT/500ML-% IV SOLN: 2.5000 [IU]/h | INTRAVENOUS | Status: AC

## 2023-03-23 MED ORDER — DIPHENHYDRAMINE HCL 25 MG PO CAPS: 25.0000 mg | ORAL_CAPSULE | ORAL | Status: DC | PRN

## 2023-03-23 MED ORDER — KETOROLAC TROMETHAMINE 30 MG/ML IJ SOLN
30.0000 mg | Freq: Four times a day (QID) | INTRAMUSCULAR | Status: AC
  Administered 2023-03-23 – 2023-03-24 (×3): 30 mg via INTRAVENOUS
  Filled 2023-03-23 (×3): qty 1

## 2023-03-23 MED ORDER — FENTANYL CITRATE (PF) 100 MCG/2ML IJ SOLN
INTRAMUSCULAR | Status: DC | PRN
  Administered 2023-03-23: 50 ug via INTRAVENOUS
  Administered 2023-03-23: 35 ug via INTRAVENOUS

## 2023-03-23 MED ORDER — PRENATAL MULTIVITAMIN CH
1.0000 | ORAL_TABLET | Freq: Every day | ORAL | Status: DC
  Administered 2023-03-24 – 2023-03-25 (×2): 1 via ORAL
  Filled 2023-03-23 (×2): qty 1

## 2023-03-23 MED ORDER — ACETAMINOPHEN 500 MG PO TABS
1000.0000 mg | ORAL_TABLET | Freq: Four times a day (QID) | ORAL | Status: AC
Start: 2023-03-23 — End: 2023-03-24
  Administered 2023-03-23 – 2023-03-24 (×3): 1000 mg via ORAL
  Filled 2023-03-23 (×3): qty 2

## 2023-03-23 MED ORDER — DIBUCAINE (PERIANAL) 1 % EX OINT: 1.0000 | TOPICAL_OINTMENT | CUTANEOUS | Status: DC | PRN

## 2023-03-23 MED ORDER — ONDANSETRON HCL 4 MG/2ML IJ SOLN
INTRAMUSCULAR | Status: AC
  Filled 2023-03-23: qty 2

## 2023-03-23 MED ORDER — ONDANSETRON HCL 4 MG/2ML IJ SOLN
4.0000 mg | Freq: Three times a day (TID) | INTRAMUSCULAR | Status: DC | PRN
  Administered 2023-03-24: 4 mg via INTRAVENOUS
  Filled 2023-03-23: qty 2

## 2023-03-23 MED ORDER — COCONUT OIL OIL
1.0000 | TOPICAL_OIL | Status: DC | PRN
  Administered 2023-03-24: 1 via TOPICAL
  Filled 2023-03-23: qty 15
  Filled 2023-03-23: qty 7.5

## 2023-03-23 MED ORDER — BUPIVACAINE IN DEXTROSE 0.75-8.25 % IT SOLN
INTRATHECAL | Status: DC | PRN
  Administered 2023-03-23: 1.6 mL via INTRATHECAL

## 2023-03-23 MED ORDER — MORPHINE SULFATE (PF) 0.5 MG/ML IJ SOLN
INTRAMUSCULAR | Status: DC | PRN
  Administered 2023-03-23: .1 mg via INTRATHECAL

## 2023-03-23 MED ORDER — TRANEXAMIC ACID-NACL 1000-0.7 MG/100ML-% IV SOLN
INTRAVENOUS | Status: AC
  Filled 2023-03-23: qty 100

## 2023-03-23 MED ORDER — DIPHENHYDRAMINE HCL 50 MG/ML IJ SOLN: 12.5000 mg | INTRAMUSCULAR | Status: DC | PRN

## 2023-03-23 MED ORDER — BUPIVACAINE 0.25 % ON-Q PUMP DUAL CATH 400 ML
400.0000 mL | INJECTION | Status: DC
  Filled 2023-03-23: qty 400

## 2023-03-23 MED ORDER — SODIUM CHLORIDE 0.9% FLUSH: 3.0000 mL | INTRAVENOUS | Status: DC | PRN

## 2023-03-23 MED ORDER — FERROUS SULFATE 325 (65 FE) MG PO TABS
325.0000 mg | ORAL_TABLET | Freq: Two times a day (BID) | ORAL | Status: DC
Start: 2023-03-23 — End: 2023-03-25
  Administered 2023-03-23 – 2023-03-25 (×4): 325 mg via ORAL
  Filled 2023-03-23 (×4): qty 1

## 2023-03-23 MED ORDER — EPHEDRINE SULFATE-NACL 50-0.9 MG/10ML-% IV SOSY
PREFILLED_SYRINGE | INTRAVENOUS | Status: DC | PRN
  Administered 2023-03-23: 5 mg via INTRAVENOUS
  Administered 2023-03-23 (×2): 10 mg via INTRAVENOUS

## 2023-03-23 MED ORDER — KETOROLAC TROMETHAMINE 30 MG/ML IJ SOLN
INTRAMUSCULAR | Status: AC
  Filled 2023-03-23: qty 1

## 2023-03-23 MED ORDER — MENTHOL 3 MG MT LOZG: 1.0000 | LOZENGE | OROMUCOSAL | Status: DC | PRN

## 2023-03-23 MED ORDER — WITCH HAZEL-GLYCERIN EX PADS
1.0000 | MEDICATED_PAD | CUTANEOUS | Status: DC | PRN
Start: 2023-03-23 — End: 2023-03-25

## 2023-03-23 MED ORDER — KETOROLAC TROMETHAMINE 30 MG/ML IJ SOLN
30.0000 mg | Freq: Four times a day (QID) | INTRAMUSCULAR | Status: AC
Start: 2023-03-23 — End: 2023-03-24

## 2023-03-23 MED ORDER — NALOXONE HCL 4 MG/10ML IJ SOLN: 1.0000 ug/kg/h | INTRAVENOUS | Status: DC | PRN

## 2023-03-23 MED ORDER — 0.9 % SODIUM CHLORIDE (POUR BTL) OPTIME
TOPICAL | Status: DC | PRN
  Administered 2023-03-23: 400 mL

## 2023-03-23 MED ORDER — LACTATED RINGERS IV SOLN: INTRAVENOUS | Status: AC

## 2023-03-23 MED ORDER — OXYTOCIN-SODIUM CHLORIDE 30-0.9 UT/500ML-% IV SOLN
INTRAVENOUS | Status: AC
  Filled 2023-03-23: qty 1000

## 2023-03-23 SURGICAL SUPPLY — 35 items
CATH KIT ON-Q SILVERSOAK 5 (CATHETERS) ×2 IMPLANT
CATH KIT ON-Q SILVERSOAK 5IN (CATHETERS) ×2 IMPLANT
CLOSURE STERI STRIP 1/2 X4 (GAUZE/BANDAGES/DRESSINGS) IMPLANT
DERMABOND ADVANCED .7 DNX12 (GAUZE/BANDAGES/DRESSINGS) ×1 IMPLANT
DRSG IV TEGADERM 3.5X4.5 STRL (GAUZE/BANDAGES/DRESSINGS) IMPLANT
DRSG OPSITE POSTOP 4X10 (GAUZE/BANDAGES/DRESSINGS) ×1 IMPLANT
DRSG OPSITE POSTOP 4X12 (GAUZE/BANDAGES/DRESSINGS) IMPLANT
DRSG OPSITE POSTOP 4X8 (GAUZE/BANDAGES/DRESSINGS) IMPLANT
DRSG TELFA 3X8 NADH STRL (GAUZE/BANDAGES/DRESSINGS) ×1 IMPLANT
ELECT CAUTERY BLADE 6.4 (BLADE) ×1 IMPLANT
ELECT REM PT RETURN 9FT ADLT (ELECTROSURGICAL) ×1
ELECTRODE REM PT RTRN 9FT ADLT (ELECTROSURGICAL) ×1 IMPLANT
EXTRACTOR VACUUM KIWI (MISCELLANEOUS) IMPLANT
GAUZE SPONGE 4X4 12PLY STRL (GAUZE/BANDAGES/DRESSINGS) ×1 IMPLANT
GLOVE BIO SURGEON STRL SZ7 (GLOVE) ×1 IMPLANT
GLOVE INDICATOR 7.5 STRL GRN (GLOVE) ×1 IMPLANT
GOWN STRL REUS W/ TWL LRG LVL3 (GOWN DISPOSABLE) ×3 IMPLANT
MANIFOLD NEPTUNE II (INSTRUMENTS) ×1 IMPLANT
MAT PREVALON FULL STRYKER (MISCELLANEOUS) ×1 IMPLANT
NS IRRIG 1000ML POUR BTL (IV SOLUTION) ×1 IMPLANT
PACK C SECTION AR (MISCELLANEOUS) ×1 IMPLANT
PAD OB MATERNITY 4.3X12.25 (PERSONAL CARE ITEMS) ×2 IMPLANT
PAD PREP OB/GYN DISP 24X41 (PERSONAL CARE ITEMS) ×1 IMPLANT
SCRUB CHG 4% DYNA-HEX 4OZ (MISCELLANEOUS) ×1 IMPLANT
STAPLER INSORB 30 2030 C-SECTI (MISCELLANEOUS) IMPLANT
STRIP CLOSURE SKIN 1/2X4 (GAUZE/BANDAGES/DRESSINGS) ×1 IMPLANT
SUT MNCRL 4-0 27XMFL (SUTURE) ×1
SUT PDS AB 1 TP1 96 (SUTURE) ×1 IMPLANT
SUT PLAIN GUT 2-0 30 C14 SG823 (SUTURE) ×1
SUT VIC AB 0 CTX36XBRD ANBCTRL (SUTURE) ×2 IMPLANT
SUTURE MNCRL 4-0 27XMF (SUTURE) ×1 IMPLANT
SUTURE PLN GUT2-0 30 C14 SG823 (SUTURE) IMPLANT
SWABSTK COMLB BENZOIN TINCTURE (MISCELLANEOUS) ×1 IMPLANT
TRAP FLUID SMOKE EVACUATOR (MISCELLANEOUS) ×1 IMPLANT
WATER STERILE IRR 500ML POUR (IV SOLUTION) ×1 IMPLANT

## 2023-03-23 NOTE — Anesthesia Preprocedure Evaluation (Signed)
Anesthesia Evaluation  Patient identified by MRN, date of birth, ID band Patient awake    Reviewed: Allergy & Precautions, NPO status , Patient's Chart, lab work & pertinent test results  History of Anesthesia Complications Negative for: history of anesthetic complications  Airway Mallampati: II  TM Distance: >3 FB Neck ROM: Full    Dental no notable dental hx. (+) Teeth Intact   Pulmonary asthma , neg sleep apnea, neg COPD, Patient abstained from smoking.Not current smoker Mild, well controlled, no inhaler use in years   Pulmonary exam normal breath sounds clear to auscultation       Cardiovascular Exercise Tolerance: Good METShypertension, Pt. on medications (-) CAD and (-) Past MI (-) dysrhythmias  Rhythm:Regular Rate:Normal - Systolic murmurs    Neuro/Psych negative neurological ROS  negative psych ROS   GI/Hepatic ,GERD  ,,(+)     (-) substance abuse    Endo/Other  neg diabetes    Renal/GU negative Renal ROS     Musculoskeletal   Abdominal   Peds  Hematology  (+) Blood dyscrasia, anemia 10 years ago there was concern for a bleeding disorder, however she saw hematologist, workup was negative for VonWillebrand disease or any qualitative or quantitative disorder. Denies blood thinner use.   Anesthesia Other Findings Past Medical History: No date: Asthma No date: Endometriosis No date: GERD (gastroesophageal reflux disease)     Comment:  pregnancy related No date: Gestational HTN No date: IBS (irritable bowel syndrome) No date: IDA (iron deficiency anemia) No date: Nuchal cord affecting delivery  Reproductive/Obstetrics (+) Pregnancy                             Anesthesia Physical Anesthesia Plan  ASA: 2  Anesthesia Plan: Spinal   Post-op Pain Management: Ofirmev IV (intra-op)* and Toradol IV (intra-op)*   Induction:   PONV Risk Score and Plan: 3 and Ondansetron and  Dexamethasone  Airway Management Planned: Natural Airway  Additional Equipment:   Intra-op Plan:   Post-operative Plan:   Informed Consent: I have reviewed the patients History and Physical, chart, labs and discussed the procedure including the risks, benefits and alternatives for the proposed anesthesia with the patient or authorized representative who has indicated his/her understanding and acceptance.       Plan Discussed with: CRNA and Surgeon  Anesthesia Plan Comments: (Discussed R/B/A of neuraxial anesthesia technique with patient: - rare risks of spinal/epidural hematoma, nerve damage, infection - Risk of PDPH - Risk of itching - Risk of nausea and vomiting - Risk of conversion to general anesthesia and its associated risks, including sore throat, damage to lips/teeth/oropharynx, and rare risks such as cardiac and respiratory events. - Risk of surgical bleeding requiring blood products - Risk of allergic reactions Discussed the role of CRNA in patient's perioperative care.  Patient voiced understanding.)       Anesthesia Quick Evaluation

## 2023-03-23 NOTE — Anesthesia Procedure Notes (Signed)
Spinal  Patient location during procedure: OR Start time: 03/23/2023 9:57 AM End time: 03/23/2023 10:01 AM Reason for block: surgical anesthesia Staffing Performed: resident/CRNA  Anesthesiologist: Corinda Gubler, MD Resident/CRNA: Elmarie Mainland, CRNA Performed by: Elmarie Mainland, CRNA Authorized by: Elmarie Mainland, CRNA   Preanesthetic Checklist Completed: patient identified, IV checked, site marked, risks and benefits discussed, surgical consent, monitors and equipment checked, pre-op evaluation and timeout performed Spinal Block Patient position: sitting Prep: ChloraPrep Patient monitoring: heart rate, continuous pulse ox, blood pressure and cardiac monitor Approach: midline Location: L3-4 Injection technique: single-shot Needle Needle type: Introducer and Pencan  Needle gauge: 24 G Needle length: 9 cm Assessment Sensory level: T10 Events: CSF return Additional Notes Sterile aseptic technique used throughout the procedure.  Negative paresthesia. Negative blood return. Positive free-flowing CSF. Expiration date of kit checked and confirmed. Patient tolerated procedure well, without complications.

## 2023-03-23 NOTE — Interval H&P Note (Signed)
History and Physical Interval Note:  03/23/2023 9:48 AM  Suzanne Romero  has presented today for surgery, with the diagnosis of prior cesarean, chronic hypertension.  The various methods of treatment have been discussed with the patient and family. After consideration of risks, benefits and other options for treatment, the patient has consented to  Procedure(s): REPEAT CESAREAN SECTION (N/A) as a surgical intervention.  The patient's history has been reviewed, patient examined, no change in status, stable for surgery.  I have reviewed the patient's chart and labs.  Questions were answered to the patient's satisfaction.    Thomasene Mohair, MD, [facility] OB[facility] 03/23/2023 9:48 AM

## 2023-03-23 NOTE — Plan of Care (Signed)
  Problem: Education: Goal: Knowledge of condition will improve Outcome: Progressing Goal: Individualized Educational Video(s) Outcome: Progressing Goal: Individualized Newborn Educational Video(s) Outcome: Progressing   Problem: Activity: Goal: Will verbalize the importance of balancing activity with adequate rest periods Outcome: Progressing Goal: Ability to tolerate increased activity will improve Outcome: Progressing   Problem: Coping: Goal: Ability to identify and utilize available resources and services will improve Outcome: Progressing   Problem: Life Cycle: Goal: Chance of risk for complications during the postpartum period will decrease Outcome: Progressing   Problem: Role Relationship: Goal: Ability to demonstrate positive interaction with newborn will improve Outcome: Progressing   Problem: Skin Integrity: Goal: Demonstration of wound healing without infection will improve Outcome: Progressing   Problem: Education: Goal: Knowledge of General Education information will improve Description: Including pain rating scale, medication(s)/side effects and non-pharmacologic comfort measures Outcome: Progressing   Problem: Health Behavior/Discharge Planning: Goal: Ability to manage health-related needs will improve Outcome: Progressing   Problem: Clinical Measurements: Goal: Ability to maintain clinical measurements within normal limits will improve Outcome: Progressing Goal: Will remain free from infection Outcome: Progressing Goal: Diagnostic test results will improve Outcome: Progressing Goal: Respiratory complications will improve Outcome: Progressing Goal: Cardiovascular complication will be avoided Outcome: Progressing   Problem: Activity: Goal: Risk for activity intolerance will decrease Outcome: Progressing   Problem: Nutrition: Goal: Adequate nutrition will be maintained Outcome: Progressing   Problem: Coping: Goal: Level of anxiety will  decrease Outcome: Progressing   Problem: Elimination: Goal: Will not experience complications related to bowel motility Outcome: Progressing Goal: Will not experience complications related to urinary retention Outcome: Progressing   Problem: Pain Management: Goal: General experience of comfort will improve Outcome: Progressing   Problem: Safety: Goal: Ability to remain free from injury will improve Outcome: Progressing   Problem: Skin Integrity: Goal: Risk for impaired skin integrity will decrease Outcome: Progressing   Problem: Education: Goal: Knowledge of the prescribed therapeutic regimen will improve Outcome: Progressing Goal: Understanding of sexual limitations or changes related to disease process or condition will improve Outcome: Progressing Goal: Individualized Educational Video(s) Outcome: Progressing   Problem: Self-Concept: Goal: Communication of feelings regarding changes in body function or appearance will improve Outcome: Progressing   Problem: Skin Integrity: Goal: Demonstration of wound healing without infection will improve Outcome: Progressing

## 2023-03-23 NOTE — Op Note (Signed)
Cesarean Section Operative Note    Patient Name: Suzanne Romero  Date of Birth: 10-29-1990  MRN: [id number]  Date of Surgery: 03/23/2023   Pre-operative Diagnosis:  1) History of cesarean delivery, desires repeat 2) Chronic hypertension affecting pregnancy, third trimester 3) intrauterine pregnancy at [redacted]w[redacted]d   Post-operative Diagnosis:  1) History of cesarean delivery, desires repeat 2) Chronic hypertension affecting pregnancy, third trimester 3) intrauterine pregnancy at [redacted]w[redacted]d    Procedure: Repeat Cesarean Delivery  Surgeon: Surgeons and Role:    * Conard Novak, MD - Primary  Assistants: Margaretmary Eddy, CNM; No other capable assistant available, in surgery requiring high level assistant. She was present until the uterus was returned to the abdomen.  Dr. Julieanne Manson was present for the rest of the case  Anesthesia: spinal   Findings:  1) normal appearing gravid uterus, fallopian tubes, and ovaries 2) viable female infant with weight of 3,460 grams, APGARs 7 and 9.   Quantified Blood Loss: 455 mL  Total IV Fluids: 700 ml   Urine Output:  60 mL clear urine  Specimens: none  Complications: no complications  Disposition: PACU - hemodynamically stable.   Maternal Condition: stable   Baby condition / location:  Couplet care / Skin to Skin  Procedure Details:  The patient was seen in the Holding Room. The risks, benefits, complications, treatment options, and expected outcomes were discussed with the patient. The patient concurred with the proposed plan, giving informed consent. identified as Suzanne Romero and the procedure verified as C-Section Delivery. A Time Out was held and the above information confirmed.   After induction of anesthesia, the patient was draped and prepped in the usual sterile manner. A Pfannenstiel incision was made and carried down through the subcutaneous tissue to the fascia. Fascial incision was made and extended transversely. The fascia was separated  from the underlying rectus tissue superiorly and inferiorly. The peritoneum was identified and entered. Peritoneal incision was extended longitudinally. The bladder flap was not bluntly or sharply freed from the lower uterine segment. A low transverse uterine incision was made and the hysterotomy was extended with cranial-caudal tension. Delivered from cephalic presentation was a 3,460 gram Living newborn infant(s) or Female with Apgar scores of 7 at one minute and 9 at five minutes. Cord ph was not sent the umbilical cord was clamped and cut cord blood was not obtained for evaluation. The placenta was removed Intact and appeared normal. The uterine outline, tubes and ovaries appeared  normal. Though, there was a simple right ovarian cyst that I did not resolve . The uterine incision was closed with running locked sutures of 0 Vicryl.  A second layer of the same suture was thrown in an imbricating fashion.  Hemostasis was assured.  The uterus was returned to the abdomen and the paracolic gutters were cleared of all clots and debris.  The rectus muscles were inspected and found to be hemostatic.  The On-Q catheter pumps were inserted in accordance with the manufacturer's recommendations.  The catheters were inserted approximately 4cm cephelad to the incision line, approximately 1cm apart, straddling the midline.  They were inserted to a depth of the 4th mark. They were positioned superficial to the rectus abdominus muscles and deep to the rectus fascia.    The fascia was then reapproximated with running sutures of 1-0 PDS, looped. The subcuticular closure was performed using 4-0 monocryl. The skin closure was reinforced using surgical skin glue.  The On-Q catheters were bolused with 5 mL of 0.5%  marcaine plain for a total of 10 mL.  The catheters were affixed to the skin with surgical skin glue, steri-strips, and tegaderm.    The surgical assistant performed tissue retraction, assistance with suturing, and  fundal pressure.  Instrument, sponge, and needle counts were correct prior the abdominal closure and were correct at the conclusion of the case.  The patient received Ancef 2 gram IV prior to skin incision (within 30 minutes). For VTE prophylaxis she was wearing SCDs throughout the case.  The assistant surgeon was an CNM due to lack of availability of another Sales promotion account executive.   Signed: Conard Novak, MD 03/23/2023 11:27 AM

## 2023-03-23 NOTE — Transfer of Care (Signed)
Immediate Anesthesia Transfer of Care Note  Patient: Suzanne Romero  Procedure(s) Performed: REPEAT CESAREAN SECTION  Patient Location: PACU and Mother/Baby  Anesthesia Type:Spinal  Level of Consciousness: awake, alert , and oriented  Airway & Oxygen Therapy: Patient Spontanous Breathing  Post-op Assessment: Report given to RN and Post -op Vital signs reviewed and stable  Post vital signs: Reviewed and stable  Last Vitals:  Vitals Value Taken Time  BP 124/65 (80) 03/23/23 1135  Temp 98.0   Pulse 76   Resp 18   SpO2 97     Last Pain:  Vitals:   03/23/23 0800  TempSrc: Oral  PainSc: 0-No pain         Complications: No notable events documented.

## 2023-03-24 ENCOUNTER — Encounter: Payer: Self-pay | Admitting: Obstetrics and Gynecology

## 2023-03-24 LAB — CBC
HCT: 28.7 % — ABNORMAL LOW (ref 36.0–46.0)
Hemoglobin: 9.2 g/dL — ABNORMAL LOW (ref 12.0–15.0)
MCH: 24.1 pg — ABNORMAL LOW (ref 26.0–34.0)
MCHC: 32.1 g/dL (ref 30.0–36.0)
MCV: 75.1 fL — ABNORMAL LOW (ref 80.0–100.0)
Platelets: 207 10*3/uL (ref 150–400)
RBC: 3.82 MIL/uL — ABNORMAL LOW (ref 3.87–5.11)
RDW: 18.1 % — ABNORMAL HIGH (ref 11.5–15.5)
WBC: 10.3 10*3/uL (ref 4.0–10.5)
nRBC: 0 % (ref 0.0–0.2)

## 2023-03-24 MED ORDER — ACETAMINOPHEN 500 MG PO TABS
1000.0000 mg | ORAL_TABLET | Freq: Four times a day (QID) | ORAL | Status: DC
  Administered 2023-03-25: 1000 mg via ORAL
  Filled 2023-03-24: qty 2

## 2023-03-24 MED ORDER — ACETAMINOPHEN 500 MG PO TABS
1000.0000 mg | ORAL_TABLET | Freq: Four times a day (QID) | ORAL | Status: DC
  Administered 2023-03-24: 1000 mg via ORAL

## 2023-03-24 MED ORDER — ACETAMINOPHEN 500 MG PO TABS
ORAL_TABLET | ORAL | Status: AC
  Filled 2023-03-24: qty 2

## 2023-03-24 NOTE — Progress Notes (Signed)
Post Partum Day 1  Subjective: Doing well, no concerns. Ambulating without difficulty, pain managed with PO meds, tolerating regular diet, and voiding without difficulty.   No fever/chills, chest pain, shortness of breath, nausea/vomiting, or leg pain. No nipple or breast pain. No headache, visual changes, or RUQ/epigastric pain.  Objective: BP 133/85 (BP Location: Right Arm)   Pulse 73   Temp 97.7 F (36.5 C) (Oral)   Resp 19   Ht 5\' 8"  (1.727 m)   Wt 103.9 kg   SpO2 99%   Breastfeeding Yes   BMI 34.82 kg/m    Physical Exam:  General: alert and cooperative Breasts: soft/nontender CV: RRR Pulm: nl effort Abdomen: soft, non-tender Uterine Fundus: firm Incision: healing well Perineum: minimal edema, intact Lochia: appropriate DVT Evaluation: No evidence of DVT seen on physical exam. Edinburgh:     03/23/2023   10:00 PM 11/30/2019    5:56 PM  Edinburgh Postnatal Depression Scale Screening Tool  I have been able to laugh and see the funny side of things. 0 0  I have looked forward with enjoyment to things. 0 0  I have blamed myself unnecessarily when things went wrong. 0 0  I have been anxious or worried for no good reason. 0 1  I have felt scared or panicky for no good reason. 0 0  Things have been getting on top of me. 0 0  I have been so unhappy that I have had difficulty sleeping. 0 0  I have felt sad or miserable. 0 0  I have been so unhappy that I have been crying. 0 0  The thought of harming myself has occurred to me. 0 0  Edinburgh Postnatal Depression Scale Total 0 1     Recent Labs    03/23/23 0947 03/24/23 0615  HGB 10.6* 9.2*  HCT 32.8* 28.7*  WBC 10.5 10.3  PLT 242 207    Assessment/Plan: 32 y.o. G2P2002 postpartum day # 1  1. Continue routine postpartum care  2. Infant feeding status: breast feeding -Lactation consult PRN for breastfeeding   3. Contraception plan: IUD  4. Acute blood loss anemia - clinically significant.   -Hemodynamically stable and asymptomatic -Intervention: continue on oral supplementation with ferrous sulfate 325  5. Immunization status:   all immunizations up to date  6. CHTN - Will continue to monitor Vitals:   03/23/23 0820 03/23/23 0831 03/23/23 1304 03/23/23 1315  BP: (!) 142/92 (!) 145/92 132/78 130/64   03/23/23 1330 03/23/23 1345 03/23/23 1433 03/23/23 1620  BP: 120/77 (!) 118/97 128/82 124/74   03/23/23 2051 03/24/23 0024 03/24/23 0517 03/24/23 0845  BP: 138/86 108/75 119/77 133/85     Disposition: Continue inpatient postpartum care    LOS: 1 day   Key Cen, CNM 03/24/2023, 10:15 AM

## 2023-03-24 NOTE — Anesthesia Post-op Follow-up Note (Signed)
  Anesthesia Pain Follow-up Note  Patient: Suzanne Romero  Day #: 1  Date of Follow-up: 03/24/2023 Time: 7:39 AM  Last Vitals:  Vitals:   03/24/23 0024 03/24/23 0517  BP: 108/75 119/77  Pulse: 63 62  Resp: 20 18  Temp: 36.6 C 36.8 C  SpO2: 98% 95%    Level of Consciousness: alert  Pain: none   Side Effects:None  Catheter Site Exam:clean, dry, no drainage     Plan: D/C from anesthesia care at surgeon's request  Rosanne Gutting

## 2023-03-24 NOTE — Anesthesia Postprocedure Evaluation (Signed)
Anesthesia Post Note  Patient: Suzanne Romero  Procedure(s) Performed: REPEAT CESAREAN SECTION  Patient location during evaluation: Mother Baby Anesthesia Type: Spinal Level of consciousness: oriented and awake and alert Pain management: pain level controlled Vital Signs Assessment: post-procedure vital signs reviewed and stable Respiratory status: spontaneous breathing and respiratory function stable Cardiovascular status: blood pressure returned to baseline and stable Postop Assessment: no headache, no backache, no apparent nausea or vomiting and able to ambulate Anesthetic complications: no   No notable events documented.   Last Vitals:  Vitals:   03/24/23 0024 03/24/23 0517  BP: 108/75 119/77  Pulse: 63 62  Resp: 20 18  Temp: 36.6 C 36.8 C  SpO2: 98% 95%    Last Pain:  Vitals:   03/24/23 0517  TempSrc: Oral  PainSc:                  Rosanne Gutting

## 2023-03-24 NOTE — Lactation Note (Signed)
This note was copied from a baby's chart. Lactation Consultation Note  Patient Name: Suzanne Romero [id number] Date: 03/24/2023 Age:33 hours Reason for consult: Initial assessment;Early term 37-38.6wks   Maternal Data This is mom's 2nd baby, repeat C/S,  vacuum assisted.   At first visit mom independently latched baby to her left breast. Per mom baby has been latching, having plenty of wet and 5 stool diapers so far. Mom reports some slight discomfort of her right nipple. Has patient been taught Hand Expression?: Yes Does the patient have breastfeeding experience prior to this delivery?: Yes How long did the patient breastfeed?: mom breastfed 1st baby for 6 weeks  Feeding Mother's Current Feeding Choice: Breast Milk Provided mom with some tips and strategies to maximize position and latch technique. Baby noted to have upper lip rolled inward. Mom was able to independently relatch baby and correct upper lip latch. Recommended mom consider alternating positioning of baby at her right breast. Discussed use of coconut oil for moist wound healing. LC number on white board for mom to call LC as needed. LATCH Score Latch: Grasps breast easily, tongue down, lips flanged, rhythmical sucking.  Audible Swallowing: Spontaneous and intermittent  Type of Nipple: Everted at rest and after stimulation  Comfort (Breast/Nipple): Filling, red/small blisters or bruises, mild/mod discomfort  Hold (Positioning): No assistance needed to correctly position infant at breast.  LATCH Score: 9  Interventions Interventions: Breast feeding basics reviewed;Assisted with latch;Position options;Coconut oil;Education  Discharge Pump: DEBP  Consult Status Consult Status: Follow-up Date: 03/25/23 Follow-up type: In-patient  Update provided to care nurse.  Fuller Song 03/24/2023, 12:38 PM

## 2023-03-25 MED ORDER — OXYCODONE-ACETAMINOPHEN 5-325 MG PO TABS
1.0000 | ORAL_TABLET | ORAL | 0 refills | Status: AC | PRN
Start: 2023-03-25 — End: ?

## 2023-03-25 MED ORDER — FERROUS SULFATE 325 (65 FE) MG PO TABS: 325.0000 mg | ORAL_TABLET | ORAL | Status: AC

## 2023-03-25 MED ORDER — DIBUCAINE (PERIANAL) 1 % EX OINT: 1.0000 | TOPICAL_OINTMENT | CUTANEOUS | Status: AC | PRN

## 2023-03-25 MED ORDER — IBUPROFEN 600 MG PO TABS: 600.0000 mg | ORAL_TABLET | Freq: Four times a day (QID) | ORAL | 0 refills | Status: AC

## 2023-03-25 MED ORDER — SENNA 8.6 MG PO TABS: 1.0000 | ORAL_TABLET | Freq: Every day | ORAL | Status: AC

## 2023-03-25 MED ORDER — FERROUS SULFATE 325 (65 FE) MG PO TABS: 325.0000 mg | ORAL_TABLET | ORAL | Status: DC

## 2023-03-25 MED ORDER — POLYETHYLENE GLYCOL 3350 17 G PO PACK: 17.0000 g | PACK | Freq: Every day | ORAL | 0 refills | Status: AC

## 2023-03-25 MED ORDER — POLYETHYLENE GLYCOL 3350 17 G PO PACK: 17.0000 g | PACK | Freq: Every day | ORAL | Status: DC

## 2023-03-25 MED ORDER — SIMETHICONE 80 MG PO CHEW: 80.0000 mg | CHEWABLE_TABLET | Freq: Three times a day (TID) | ORAL | Status: AC

## 2023-03-25 MED ORDER — COCONUT OIL OIL: 1.0000 | TOPICAL_OIL | Status: AC | PRN

## 2023-03-25 MED ORDER — WITCH HAZEL-GLYCERIN EX PADS: 1.0000 | MEDICATED_PAD | CUTANEOUS | Status: AC | PRN

## 2023-03-25 MED ORDER — SENNA 8.6 MG PO TABS
1.0000 | ORAL_TABLET | Freq: Every day | ORAL | Status: DC
  Filled 2023-03-25: qty 1

## 2023-03-25 NOTE — Lactation Note (Signed)
This note was copied from a baby's chart. Lactation rounds as pt being discharged to home, mom states baby is breastfeeding well, she has no needs at this time.

## 2023-03-25 NOTE — Discharge Summary (Signed)
Postpartum Discharge Summary  Patient Name: Suzanne Romero DOB: 19-Mar-1991 MRN: [id number]  Date of admission: 03/23/2023 Delivery date:03/23/2023 Delivering provider: Thomasene Mohair D Date of discharge: 03/25/2023  Primary OB: [facility] LMP:No LMP recorded. EDC Estimated Date of Delivery: 04/06/23 Gestational Age at Delivery: [redacted]w[redacted]d   Admitting diagnosis: History of cesarean delivery, antepartum [O34.219] Intrauterine pregnancy: [redacted]w[redacted]d     Secondary diagnosis:   Principal Problem:   Cesarean delivery delivered Active Problems:   Chronic hypertension affecting pregnancy   [redacted] weeks gestation of pregnancy   Discharge Diagnosis: Term Pregnancy Delivered, CHTN, and Anemia      Hospital course: Sceduled C/S   32 y.o. yo G2P2002 at [redacted]w[redacted]d was admitted to the hospital 03/23/2023 for scheduled cesarean section with the following indication:Elective Repeat.Delivery details are as follows:  Membrane Rupture Time/Date: 10:31 AM,03/23/2023  Delivery Method:C-Section, Vacuum Assisted Details of operation can be found in separate operative note.  Patient had an uncomplicated postpartum course.  She is ambulating, tolerating a regular diet, passing flatus, and urinating well. Patient is discharged home in stable condition on  03/25/23        Newborn Data: Birth date:03/23/2023 Birth time:10:36 AM Gender:Female Living status:Living Apgars:7 ,9  Weight:3460 g                                              Post partum procedures: none Delivery Type: repeat cesarean section, low transverse incision Anesthesia: spinal anesthesia Placenta: manual removal To Pathology: No   Prenatal Labs:   Blood type/Rh A positive  Antibody screen negative  Rubella Immune  Varicella Immune    RPR NR  HBsAg negative  HepC negative  HIV negative  GC negative  Chlamydia negative  Genetic screening NIPS negative  1 hour GTT Early: 100, 28 weeks: 133  3 hour GTT N/a  GBS pending     Magnesium  Sulfate received: No BMZ received: No Rhophylac:was not indicated MMR: was not indicated Varivax vaccine given: was not indicated T-DaP:Given prenatally Flu: Given prenatally RSV >14 days 02/01/23 Transfusion:No  Physical exam  Vitals:   03/24/23 2339 03/25/23 0307 03/25/23 0810 03/25/23 1224  BP: 110/66 (!) 108/56 128/79 109/87  Pulse: 61 (!) 55 72 79  Resp: 18 18 20 18   Temp: 98.3 F (36.8 C) 98.2 F (36.8 C) 99 F (37.2 C) 98.5 F (36.9 C)  TempSrc:  Oral Oral Oral  SpO2: 100% 100% 97%   Weight:      Height:       General: alert, cooperative, and no distress Lochia: appropriate Uterine Fundus: firm Perineum:minimal edema/intact Incision: Healing well with no significant drainage, No significant erythema, Dressing is clean, dry, and intact, covered with occlusive OP site dressing  DVT Evaluation: No evidence of DVT seen on physical exam. Negative Homan's sign. No cords or calf tenderness. No significant calf/ankle edema.  Labs: Lab Results  Component Value Date   WBC 10.3 03/24/2023   HGB 9.2 (L) 03/24/2023   HCT 28.7 (L) 03/24/2023   MCV 75.1 (L) 03/24/2023   PLT 207 03/24/2023      Latest Ref Rng & Units 03/23/2023    9:47 AM  CMP  Glucose 70 - 99 mg/dL 72   BUN 6 - 20 mg/dL 6   Creatinine 9.56 - 2.13 mg/dL 0.86   Sodium 578 - 469 mmol/L 137   Potassium 3.5 -  5.1 mmol/L 3.7   Chloride 98 - 111 mmol/L 109   CO2 22 - 32 mmol/L 18   Calcium 8.9 - 10.3 mg/dL 8.9   Total Protein 6.5 - 8.1 g/dL 6.3   Total Bilirubin <4.1 mg/dL 0.5   Alkaline Phos 38 - 126 U/L 103   AST 15 - 41 U/L 13   ALT 0 - 44 U/L 9    Edinburgh Score:    03/23/2023   10:00 PM  Edinburgh Postnatal Depression Scale Screening Tool  I have been able to laugh and see the funny side of things. 0  I have looked forward with enjoyment to things. 0  I have blamed myself unnecessarily when things went wrong. 0  I have been anxious or worried for no good reason. 0  I have felt scared or  panicky for no good reason. 0  Things have been getting on top of me. 0  I have been so unhappy that I have had difficulty sleeping. 0  I have felt sad or miserable. 0  I have been so unhappy that I have been crying. 0  The thought of harming myself has occurred to me. 0  Edinburgh Postnatal Depression Scale Total 0    Risk assessment for postpartum VTE and prophylactic treatment: Very high risk factors: None High risk factors: None Moderate risk factors: Cesarean delivery  and BMI 30-40 kg/m2  Postpartum VTE prophylaxis with LMWH not indicated  After visit meds:  Allergies as of 03/25/2023   No Known Allergies      Medication List     STOP taking these medications    aspirin EC 81 MG tablet   calcium carbonate 500 MG chewable tablet Commonly known as: TUMS - dosed in mg elemental calcium   doxylamine (Sleep) 25 MG tablet Commonly known as: UNISOM   NIFEdipine 30 MG 24 hr tablet Commonly known as: PROCARDIA-XL/NIFEDICAL-XL   pyridOXINE 50 MG tablet Commonly known as: B-6       TAKE these medications    albuterol 108 (90 Base) MCG/ACT inhaler Commonly known as: VENTOLIN HFA Inhale 1-2 puffs into the lungs every 6 (six) hours as needed for wheezing or shortness of breath (ONLY USES WHEN SHE GETS SICK).   coconut oil Oil Apply 1 Application topically as needed.   dibucaine 1 % Oint Commonly known as: NUPERCAINAL Place 1 Application rectally as needed for hemorrhoids.   ferrous sulfate 325 (65 FE) MG tablet Take 1 tablet (325 mg total) by mouth every Monday, Wednesday, and Friday. Start taking on: March 28, 2023 What changed: when to take this   ibuprofen 600 MG tablet Commonly known as: ADVIL Take 1 tablet (600 mg total) by mouth every 6 (six) hours. What changed:  medication strength how much to take when to take this   oxyCODONE-acetaminophen 5-325 MG tablet Commonly known as: PERCOCET/ROXICET Take 1-2 tablets by mouth every 4 (four) hours as  needed for moderate pain (pain score 4-6).   polyethylene glycol 17 g packet Commonly known as: MIRALAX / GLYCOLAX Take 17 g by mouth daily. Start taking on: March 26, 2023   Prenatal 28-0.8 MG Tabs Take 1 tablet by mouth daily.   senna 8.6 MG Tabs tablet Commonly known as: SENOKOT Take 1 tablet (8.6 mg total) by mouth daily. Start taking on: March 26, 2023   simethicone 80 MG chewable tablet Commonly known as: MYLICON Chew 1 tablet (80 mg total) by mouth 3 (three) times daily after meals.   witch  hazel-glycerin pad Commonly known as: TUCKS Apply 1 Application topically as needed for hemorrhoids.       Discharge home in stable condition Infant Feeding: Breast Infant Disposition:home with mother Discharge instruction: per After Visit Summary and Postpartum booklet. Activity: Advance as tolerated. Pelvic rest for 6 weeks.  Diet: routine diet Anticipated Birth Control: IUD Postpartum Appointment:6 weeks Additional Postpartum F/U: Incision check 1 week Future Appointments:No future appointments. Follow up Visit:  Follow-up Information     Conard Novak, MD. Go on 04/04/2023.   Specialty: Obstetrics and Gynecology Why: Post-op follow up, For incision check, (Keep previously scheduled appointment) Contact information: [address] [place] [address] [phone]                 Plan:  Ebany Ravindran was discharged to home in good condition. Follow-up appointment as directed.    Signed: Chari Manning CNM

## 2023-03-25 NOTE — Discharge Instructions (Signed)

## 2023-03-25 NOTE — Progress Notes (Signed)
Discharge instructions given and reviewed with pt and family. Pt educated on follow up care, appointments and when to notify provider, questions invited and answered. ID bands matched with infant.  Pt and family noted understanding  to teaching/education.Pt escorted off unit by volunteer in wheelchair with infant safely in arms. Infant secured in car seat by family.Discharge instructions given and reviewed with pt and family. Pt educated on follow up care, appointments and when to notify provider, questions invited and answered. ID bands matched with infant.  Pt noted understanding  to teaching/education.Pt escorted off unit by RN in wheelchair with infant safely in  car seat  and car seat on moms lap. Infant secured in car seat by parents.
